# Patient Record
Sex: Male | Born: 1950 | Hispanic: No | Marital: Married | Smoking: Former smoker
Health system: Southern US, Community
[De-identification: ages and names within clinical notes are randomized; demographics above are authoritative.]

## PROBLEM LIST (undated history)

## (undated) DIAGNOSIS — Z720 Tobacco use: Secondary | ICD-10-CM

## (undated) DIAGNOSIS — J449 Chronic obstructive pulmonary disease, unspecified: Secondary | ICD-10-CM

## (undated) DIAGNOSIS — I2699 Other pulmonary embolism without acute cor pulmonale: Secondary | ICD-10-CM

## (undated) HISTORY — PX: NO PAST SURGERIES: SHX2092

---

## 2006-02-21 ENCOUNTER — Emergency Department: Payer: Self-pay | Admitting: Unknown Physician Specialty

## 2008-07-19 ENCOUNTER — Ambulatory Visit: Payer: Self-pay | Admitting: Family Medicine

## 2009-06-13 ENCOUNTER — Encounter: Payer: Self-pay | Admitting: Cardiovascular Disease

## 2009-08-22 ENCOUNTER — Emergency Department: Payer: Self-pay | Admitting: Emergency Medicine

## 2009-09-04 ENCOUNTER — Encounter: Payer: Self-pay | Admitting: Cardiovascular Disease

## 2009-11-07 ENCOUNTER — Ambulatory Visit: Payer: Self-pay | Admitting: Internal Medicine

## 2016-01-30 ENCOUNTER — Encounter: Payer: Self-pay | Admitting: Emergency Medicine

## 2016-01-30 ENCOUNTER — Ambulatory Visit
Admission: EM | Admit: 2016-01-30 | Discharge: 2016-01-30 | Disposition: A | Discharge status: Home / Self Care | Payer: Commercial Managed Care - PPO | Attending: Family Medicine | Admitting: Family Medicine

## 2016-01-30 DIAGNOSIS — H6122 Impacted cerumen, left ear: Secondary | ICD-10-CM | POA: Diagnosis not present

## 2016-01-30 DIAGNOSIS — H6091 Unspecified otitis externa, right ear: Secondary | ICD-10-CM | POA: Diagnosis not present

## 2016-01-30 MED ORDER — NEOMYCIN-POLYMYXIN-HC 3.5-10000-1 OT SUSP: 4.0000 [drp] | Freq: Three times a day (TID) | OTIC | Status: DC

## 2016-01-30 NOTE — Discharge Instructions (Signed)

## 2016-01-30 NOTE — ED Notes (Signed)
Patient c/o fullness in his ears for couple of days.

## 2016-03-09 NOTE — ED Provider Notes (Signed)
CSN: [id number]     Arrival date & time 01/30/16  1356 History   First MD Initiated Contact with Patient 01/30/16 1550     Chief Complaint  Patient presents with  . Ear Fullness   (Consider location/radiation/quality/duration/timing/severity/associated sxs/prior Treatment) HPI Comments: 65 yo male with a c/o ear fullness and discomfort for the past 2-3 days. Denies any fevers, chills, or drainage.   Patient is a 65 y.o. male presenting with plugged ear sensation. The history is provided by the patient.  Ear Fullness    History reviewed. No pertinent past medical history. History reviewed. No pertinent past surgical history. History reviewed. No pertinent family history. Social History  Substance Use Topics  . Smoking status: Current Every Day Smoker -- 1.00 packs/day    Types: Cigarettes  . Smokeless tobacco: None  . Alcohol Use: No    Review of Systems  Allergies  Review of patient's allergies indicates no known allergies.  Home Medications   Prior to Admission medications   Medication Sig Start Date End Date Taking? Authorizing Provider  neomycin-polymyxin-hydrocortisone (CORTISPORIN) 3.5-10000-1 otic suspension Place 4 drops into the right ear 3 (three) times daily. 01/30/16   Payton Mccallum, MD   Meds Ordered and Administered this Visit  Medications - No data to display  BP 116/78 mmHg  Pulse 63  Temp(Src) 97 F (36.1 C) (Tympanic)  Resp 16  Ht  (1.778 m)  Wt 210 lb (95.255 kg)  BMI 30.13 kg/m2  SpO2 98% No data found.   Physical Exam  Constitutional: He appears well-developed and well-nourished. No distress.  HENT:  Head: Normocephalic and atraumatic.  Right Ear: There is drainage and swelling.  Left Ear: Tympanic membrane and external ear normal.  Nose: Nose normal.  Mouth/Throat: Uvula is midline, oropharynx is clear and moist and mucous membranes are normal. No oropharyngeal exudate or tonsillar abscesses.  Left ear canal with cerumen impaction   Eyes: Conjunctivae and EOM are normal. Pupils are equal, round, and reactive to light. Right eye exhibits no discharge. Left eye exhibits no discharge. No scleral icterus.  Neck: Normal range of motion. Neck supple. No tracheal deviation present. No thyromegaly present.  Cardiovascular: Normal rate, regular rhythm and normal heart sounds.   Pulmonary/Chest: Effort normal and breath sounds normal. No stridor. No respiratory distress. He has no wheezes. He has no rales. He exhibits no tenderness.  Lymphadenopathy:    He has no cervical adenopathy.  Neurological: He is alert.  Skin: Skin is warm and dry. No rash noted. He is not diaphoretic.  Nursing note and vitals reviewed.   ED Course  Procedures (including critical care time)  Labs Review Labs Reviewed - No data to display  Imaging Review No results found.   Visual Acuity Review  Right Eye Distance:   Left Eye Distance:   Bilateral Distance:    Right Eye Near:   Left Eye Near:    Bilateral Near:         MDM   1. Cerumen impaction, left   2. Otitis externa, right    Discharge Medication List as of 01/30/2016  4:34 PM    START taking these medications   Details  neomycin-polymyxin-hydrocortisone (CORTISPORIN) 3.5-10000-1 otic suspension Place 4 drops into the right ear 3 (three) times daily., Starting 01/30/2016, Until Discontinued, Normal       1. diagnosis reviewed with patient 2. rx as per orders above; reviewed possible side effects, interactions, risks and benefits  3. Recommend supportive treatment with otc  analgesics prn 4. Follow-up prn if symptoms worsen or don't improve    Payton Mccallumrlando Denijah Karrer, MD 03/09/16 2013

## 2016-10-29 ENCOUNTER — Encounter: Payer: Self-pay | Admitting: Emergency Medicine

## 2016-10-29 ENCOUNTER — Emergency Department: Payer: Medicare Other

## 2016-10-29 ENCOUNTER — Inpatient Hospital Stay
Admission: EM | Admit: 2016-10-29 | Discharge: 2016-11-01 | DRG: 175 | Disposition: A | Discharge status: Home / Self Care | Payer: Medicare Other | Attending: Internal Medicine | Admitting: Internal Medicine

## 2016-10-29 ENCOUNTER — Inpatient Hospital Stay: Payer: Medicare Other

## 2016-10-29 DIAGNOSIS — I82411 Acute embolism and thrombosis of right femoral vein: Secondary | ICD-10-CM | POA: Diagnosis present

## 2016-10-29 DIAGNOSIS — R7989 Other specified abnormal findings of blood chemistry: Secondary | ICD-10-CM | POA: Diagnosis not present

## 2016-10-29 DIAGNOSIS — R0602 Shortness of breath: Secondary | ICD-10-CM

## 2016-10-29 DIAGNOSIS — I82431 Acute embolism and thrombosis of right popliteal vein: Secondary | ICD-10-CM | POA: Diagnosis present

## 2016-10-29 DIAGNOSIS — F17210 Nicotine dependence, cigarettes, uncomplicated: Secondary | ICD-10-CM | POA: Diagnosis present

## 2016-10-29 DIAGNOSIS — I2699 Other pulmonary embolism without acute cor pulmonale: Secondary | ICD-10-CM | POA: Diagnosis present

## 2016-10-29 DIAGNOSIS — J439 Emphysema, unspecified: Secondary | ICD-10-CM | POA: Diagnosis present

## 2016-10-29 DIAGNOSIS — J9601 Acute respiratory failure with hypoxia: Secondary | ICD-10-CM | POA: Diagnosis present

## 2016-10-29 DIAGNOSIS — I82409 Acute embolism and thrombosis of unspecified deep veins of unspecified lower extremity: Secondary | ICD-10-CM

## 2016-10-29 DIAGNOSIS — I82811 Embolism and thrombosis of superficial veins of right lower extremities: Secondary | ICD-10-CM | POA: Diagnosis present

## 2016-10-29 DIAGNOSIS — I248 Other forms of acute ischemic heart disease: Secondary | ICD-10-CM | POA: Diagnosis present

## 2016-10-29 HISTORY — DX: Chronic obstructive pulmonary disease, unspecified: J44.9

## 2016-10-29 HISTORY — DX: Tobacco use: Z72.0

## 2016-10-29 LAB — APTT: aPTT: 34 seconds (ref 24–36)

## 2016-10-29 LAB — INFLUENZA PANEL BY PCR (TYPE A & B)
Influenza A By PCR: NEGATIVE
Influenza B By PCR: NEGATIVE

## 2016-10-29 LAB — BASIC METABOLIC PANEL
ANION GAP: 9 (ref 5–15)
BUN: 11 mg/dL (ref 6–20)
CALCIUM: 9.5 mg/dL (ref 8.9–10.3)
CO2: 28 mmol/L (ref 22–32)
Chloride: 103 mmol/L (ref 101–111)
Creatinine, Ser: 0.91 mg/dL (ref 0.61–1.24)
GFR calc Af Amer: >60 mL/min (ref >60)
GFR calc non Af Amer: >60 mL/min (ref >60)
GLUCOSE: 104 mg/dL — AB (ref 65–99)
Potassium: 4.1 mmol/L (ref 3.5–5.1)
Sodium: 140 mmol/L (ref 135–145)

## 2016-10-29 LAB — CBC
HCT: 46.4 % (ref 40.0–52.0)
HEMOGLOBIN: 15.4 g/dL (ref 13.0–18.0)
MCH: 31.3 pg (ref 26.0–34.0)
MCHC: 33.2 g/dL (ref 32.0–36.0)
MCV: 94.2 fL (ref 80.0–100.0)
Platelets: 187 10*3/uL (ref 150–440)
RBC: 4.93 MIL/uL (ref 4.40–5.90)
RDW: 14.3 % (ref 11.5–14.5)
WBC: 16.5 10*3/uL — ABNORMAL HIGH (ref 3.8–10.6)

## 2016-10-29 LAB — TROPONIN I
TROPONIN I: 0.11 ng/mL — AB (ref <0.03)
Troponin I: 0.45 ng/mL (ref <0.03)
Troponin I: 0.43 ng/mL (ref <0.03)
Troponin I: 0.32 ng/mL (ref <0.03)

## 2016-10-29 LAB — HEPARIN LEVEL (UNFRACTIONATED): HEPARIN UNFRACTIONATED: 0.29 [IU]/mL — AB (ref 0.30–0.70)

## 2016-10-29 LAB — PROTIME-INR
INR: 1.09
Prothrombin Time: 14.1 seconds (ref 11.4–15.2)

## 2016-10-29 LAB — BRAIN NATRIURETIC PEPTIDE: B NATRIURETIC PEPTIDE 5: 26 pg/mL (ref 0.0–100.0)

## 2016-10-29 LAB — FIBRIN DERIVATIVES D-DIMER (ARMC ONLY): Fibrin derivatives D-dimer (ARMC): 8704 — ABNORMAL HIGH (ref 0–499)

## 2016-10-29 MED ORDER — NICOTINE 21 MG/24HR TD PT24
21.0000 mg | MEDICATED_PATCH | Freq: Every day | TRANSDERMAL | Status: DC
  Administered 2016-10-29 – 2016-11-01 (×4): 21 mg via TRANSDERMAL
  Filled 2016-10-29 (×4): qty 1

## 2016-10-29 MED ORDER — ONDANSETRON HCL 4 MG/2ML IJ SOLN: 4.0000 mg | Freq: Four times a day (QID) | INTRAMUSCULAR | Status: DC | PRN

## 2016-10-29 MED ORDER — SODIUM CHLORIDE 0.9% FLUSH
3.0000 mL | Freq: Two times a day (BID) | INTRAVENOUS | Status: DC
  Administered 2016-10-31: 3 mL via INTRAVENOUS

## 2016-10-29 MED ORDER — ACETAMINOPHEN 650 MG RE SUPP: 650.0000 mg | Freq: Four times a day (QID) | RECTAL | Status: DC | PRN

## 2016-10-29 MED ORDER — KETOROLAC TROMETHAMINE 30 MG/ML IJ SOLN
30.0000 mg | Freq: Four times a day (QID) | INTRAMUSCULAR | Status: DC | PRN
  Filled 2016-10-29: qty 1

## 2016-10-29 MED ORDER — HEPARIN BOLUS VIA INFUSION
1300.0000 [IU] | Freq: Once | INTRAVENOUS | Status: AC
  Administered 2016-10-29: 1300 [IU] via INTRAVENOUS
  Filled 2016-10-29: qty 1300

## 2016-10-29 MED ORDER — HEPARIN (PORCINE) IN NACL 100-0.45 UNIT/ML-% IJ SOLN
1900.0000 [IU]/h | INTRAMUSCULAR | Status: DC
  Administered 2016-10-29: 1600 [IU]/h via INTRAVENOUS
  Administered 2016-10-30: 1900 [IU]/h via INTRAVENOUS
  Administered 2016-10-30: 1750 [IU]/h via INTRAVENOUS
  Administered 2016-10-31: 1900 [IU]/h via INTRAVENOUS
  Filled 2016-10-29 (×4): qty 250

## 2016-10-29 MED ORDER — ONDANSETRON HCL 4 MG PO TABS: 4.0000 mg | ORAL_TABLET | Freq: Four times a day (QID) | ORAL | Status: DC | PRN

## 2016-10-29 MED ORDER — IOPAMIDOL (ISOVUE-370) INJECTION 76%
75.0000 mL | Freq: Once | INTRAVENOUS | Status: AC | PRN
  Administered 2016-10-29: 75 mL via INTRAVENOUS

## 2016-10-29 MED ORDER — IPRATROPIUM-ALBUTEROL 0.5-2.5 (3) MG/3ML IN SOLN: 3.0000 mL | Freq: Four times a day (QID) | RESPIRATORY_TRACT | Status: DC | PRN

## 2016-10-29 MED ORDER — ACETAMINOPHEN 325 MG PO TABS: 650.0000 mg | ORAL_TABLET | Freq: Four times a day (QID) | ORAL | Status: DC | PRN

## 2016-10-29 MED ORDER — HEPARIN SODIUM (PORCINE) 5000 UNIT/ML IJ SOLN
4000.0000 [IU] | Freq: Once | INTRAMUSCULAR | Status: AC
  Administered 2016-10-29: 4000 [IU] via INTRAVENOUS
  Filled 2016-10-29: qty 1

## 2016-10-29 MED ORDER — HEPARIN (PORCINE) IN NACL 100-0.45 UNIT/ML-% IJ SOLN: 12.0000 [IU]/kg/h | INTRAMUSCULAR | Status: DC

## 2016-10-29 MED ORDER — IPRATROPIUM-ALBUTEROL 0.5-2.5 (3) MG/3ML IN SOLN
3.0000 mL | Freq: Once | RESPIRATORY_TRACT | Status: AC
  Administered 2016-10-29: 3 mL via RESPIRATORY_TRACT
  Filled 2016-10-29: qty 3

## 2016-10-29 NOTE — Plan of Care (Signed)
Problem: Phase I Progression Outcomes Goal: Pain controlled with appropriate interventions Outcome: Progressing Patient has no complaints of pain.  Goal: Voiding-avoid urinary catheter unless indicated Outcome: Progressing Patient voiding in the urinal without any issue.

## 2016-10-29 NOTE — ED Notes (Signed)
Pt denies wanting any pain meds at this time. edp aware. Pt unable to take a deep breath due to right side rib pain and oxygen levels drop in the eighties when he falls asleep.

## 2016-10-29 NOTE — ED Notes (Signed)
Admitting md in to see pt at this time. 

## 2016-10-29 NOTE — ED Notes (Signed)
Pt o2 noted to drop in the high eighties when sleeping. NAD.

## 2016-10-29 NOTE — ED Triage Notes (Signed)
Pt complains of cough and congestion for two weeks and states today he was walking the dog and became short of breath. Pt states he feels he can not catch his breath.

## 2016-10-29 NOTE — Progress Notes (Signed)
ANTICOAGULATION CONSULT NOTE - Follow-up Consult  Pharmacy Consult for Heparin Drip Indication: pulmonary embolus  No Known Allergies  Patient Measurements: Height: 5\' 10"  (177.8 cm) Weight: 190 lb 4.8 oz (86.3 kg) IBW/kg (Calculated) : 73 Heparin Dosing Weight: 86.3 kg  Vital Signs: Temp: 99.1 F (37.3 C) (02/02 1914) Temp Source: Oral (02/02 1914) BP: 116/73 (02/02 1914) Pulse Rate: 88 (02/02 1914)  Labs:  Recent Labs  10/29/16 0739 10/29/16 1240 10/29/16 1600  HGB 15.4  --   --   HCT 46.4  --   --   PLT 187  --   --   APTT 34  --   --   LABPROT 14.1  --   --   INR 1.09  --   --   HEPARINUNFRC  --   --  0.29*  CREATININE 0.91  --   --   TROPONINI 0.11* 0.45* 0.43*    Estimated Creatinine Clearance: 83.6 mL/min (by C-G formula based on SCr of 0.91 mg/dL).   Medical History: Past Medical History:  Diagnosis Date  . COPD (chronic obstructive pulmonary disease) (HCC)   . Tobacco abuse     Medications:  Scheduled:  . nicotine  21 mg Transdermal Daily  . sodium chloride flush  3 mL Intravenous Q12H   Infusions:  . heparin 1,750 Units/hr (10/29/16 1737)    Assessment: Pharmacy consulted to dose and monitor heparin drip for this 66 yo male presenting to the ED with SOB and elevated troponin.   Goal of Therapy:  Heparin level 0.3-0.7 units/ml Monitor platelets by anticoagulation protocol: Yes   Plan:  Give 4000 units bolus x 1 Start heparin infusion at 1600 units/hr Check anti-Xa level in 6 hours and daily while on heparin Continue to monitor H&H and platelets   Heparin level ordered for 2/2 at 16:00.  2/2 1600: HL subtherapeutic @ 0.29. Will give a heparin bolus of 1300units x1 followed by an increase in heparin to 1750units/hr. Will order a HL in 6hr. CBC ordered for tomorrow.   Delsa BernKelly m Fuhrmann, PharmD 10/29/2016,7:14 PM

## 2016-10-29 NOTE — Progress Notes (Signed)
ANTICOAGULATION CONSULT NOTE - Initial Consult  Pharmacy Consult for Heparin Drip Indication: pulmonary embolus  No Known Allergies  Patient Measurements: Height: 5\' 10"  (177.8 cm) Weight: 210 lb (95.3 kg) IBW/kg (Calculated) : 73 Heparin Dosing Weight: 92.5 kg  Vital Signs: Temp: 97.8 F (36.6 C) (02/02 0722) Temp Source: Oral (02/02 0722) BP: 140/101 (02/02 0938) Pulse Rate: 109 (02/02 0938)  Labs:  Recent Labs  10/29/16 0739  HGB 15.4  HCT 46.4  PLT 187  APTT 34  LABPROT 14.1  INR 1.09  CREATININE 0.91  TROPONINI 0.11*    Estimated Creatinine Clearance: 93.8 mL/min (by C-G formula based on SCr of 0.91 mg/dL).   Medical History: History reviewed. No pertinent past medical history.  Medications:  Scheduled:   Infusions:  . heparin 1,600 Units/hr (10/29/16 1013)    Assessment: Pharmacy consulted to dose and monitor heparin drip for this 66 yo male presenting to the ED with SOB and elevated troponin.   Goal of Therapy:  Heparin level 0.3-0.7 units/ml Monitor platelets by anticoagulation protocol: Yes   Plan:  Give 4000 units bolus x 1 Start heparin infusion at 1600 units/hr Check anti-Xa level in 6 hours and daily while on heparin Continue to monitor H&H and platelets   Heparin level ordered for 2/2 at 16:00.  Stormy CardKatsoudas,Nancyjo Givhan K, RPh Clinical Pharmacist 10/29/2016,10:22 AM

## 2016-10-29 NOTE — ED Notes (Signed)
edp at bedside. Pt states he smokes a pack of cigarettes per day for 123yrs. Pt denies any medical hx. Pt placed on 4liters of oxygen n c.

## 2016-10-29 NOTE — ED Notes (Signed)
Attempt made to call report. Primary RN and Consulting civil engineerCharge RN both unavailable to take report. Will attempt again shortly.

## 2016-10-29 NOTE — H&P (Signed)
[facility] - [place] at [facility]   PATIENT NAME: Aaron Villanueva    MR#:  [id number]  DATE OF BIRTH:  [birth year]  DATE OF ADMISSION:  10/29/2016  PRIMARY CARE PHYSICIAN: Lauro RegulusANDERSON,MARSHALL W., MD   REQUESTING/REFERRING PHYSICIAN: Dr. Lacretia NicksBrian Quigley  CHIEF COMPLAINT:   Chief Complaint  Patient presents with  . Shortness of Breath    HISTORY OF PRESENT ILLNESS:  Aaron Villanueva  is a 66 y.o. male with a known history of COPD, ongoing tobacco abuse who presents to the hospital due to shortness of breath that began earlier this morning. Patient says he was walking his dog early this morning and became really winded and short of breath. He says that he has been having some exertional dyspnea over the past 2 days which has progressively gotten worse. He admits to a cough which is nonproductive, and now for the past 2 days has been having worsening right-sided pleuritic chest pain and since today it got much worse and came to the ER for further evaluation. Patient underwent a CT scan of the chest which showed pulmonary embolism and therefore hospitalist services were contacted further treatment and evaluation. Patient denies any recent calf pain, nausea, vomiting, dizziness, palpitations or any other associated symptoms presently.  PAST MEDICAL HISTORY:   Past Medical History:  Diagnosis Date  . COPD (chronic obstructive pulmonary disease) (HCC)   . Tobacco abuse     PAST SURGICAL HISTORY:  History reviewed. No pertinent surgical history.  SOCIAL HISTORY:   Social History  Substance Use Topics  . Smoking status: Current Every Day Smoker    Packs/day: 1.00    Years: 50.00    Types: Cigarettes  . Smokeless tobacco: Never Used  . Alcohol use No    FAMILY HISTORY:   Family History  Problem Relation Age of Onset  . Lung cancer Mother     DRUG ALLERGIES:  No Known Allergies  REVIEW OF SYSTEMS:   Review of Systems  Constitutional: Negative for fever and  weight loss.  HENT: Negative for congestion, nosebleeds and tinnitus.   Eyes: Negative for blurred vision, double vision and redness.  Respiratory: Positive for shortness of breath. Negative for cough and hemoptysis.   Cardiovascular: Positive for chest pain (Pleuritic). Negative for orthopnea, leg swelling and PND.  Gastrointestinal: Negative for abdominal pain, diarrhea, melena, nausea and vomiting.  Genitourinary: Negative for dysuria, hematuria and urgency.  Musculoskeletal: Negative for falls and joint pain.  Neurological: Negative for dizziness, tingling, sensory change, focal weakness, seizures, weakness and headaches.  Endo/Heme/Allergies: Negative for polydipsia. Does not bruise/bleed easily.  Psychiatric/Behavioral: Negative for depression and memory loss. The patient is not nervous/anxious.     MEDICATIONS AT HOME:   Prior to Admission medications   Not on File      VITAL SIGNS:  Blood pressure 134/87, pulse (!) 101, temperature 97.8 F (36.6 C), temperature source Oral, resp. rate (!) 22, height 5\' 10"  (1.778 m), weight 95.3 kg (210 lb), SpO2 92 %.  PHYSICAL EXAMINATION:  Physical Exam  GENERAL:  66 y.o.-year-old patient lying in the bed with no acute distress.  EYES: Pupils equal, round, reactive to light and accommodation. No scleral icterus. Extraocular muscles intact.  HEENT: Head atraumatic, normocephalic. Oropharynx and nasopharynx clear. No oropharyngeal erythema, moist oral mucosa  NECK:  Supple, no jugular venous distention. No thyroid enlargement, no tenderness.  LUNGS: Poor respiratory effort, prolonged inspiratory and expiratory phase , no wheezing, rales, rhonchi. No use of accessory muscles of respiration.  CARDIOVASCULAR: S1, S2 RRR. No murmurs, rubs, gallops, clicks.  ABDOMEN: Soft, nontender, nondistended. Bowel sounds present. No organomegaly or mass.  EXTREMITIES: No pedal edema, cyanosis, or clubbing. + 2 pedal & radial pulses b/l.   NEUROLOGIC:  Cranial nerves II through XII are intact. No focal Motor or sensory deficits appreciated b/l PSYCHIATRIC: The patient is alert and oriented x 3. Good affect.  SKIN: No obvious rash, lesion, or ulcer.   LABORATORY PANEL:   CBC  Recent Labs Lab 10/29/16 0739  WBC 16.5*  HGB 15.4  HCT 46.4  PLT 187   ------------------------------------------------------------------------------------------------------------------  Chemistries   Recent Labs Lab 10/29/16 0739  NA 140  K 4.1  CL 103  CO2 28  GLUCOSE 104*  BUN 11  CREATININE 0.91  CALCIUM 9.5   ------------------------------------------------------------------------------------------------------------------  Cardiac Enzymes  Recent Labs Lab 10/29/16 0739  TROPONINI 0.11*   ------------------------------------------------------------------------------------------------------------------  RADIOLOGY:  Dg Chest 2 View  Result Date: 10/29/2016 CLINICAL DATA:  Shortness of breath . EXAM: CHEST  2 VIEW COMPARISON:  08/23/2009 FINDINGS: Cardiomegaly with mild pulmonary vascular prominence and interstitial prominence consistent mild CHF. Pneumonitis cannot be excluded. Small right pleural effusion. No pneumothorax . IMPRESSION: Cardiomegaly with bilateral from interstitial prominence of small right pleural effusion consistent CHF. Mild pneumonitis cannot be excluded. Electronically Signed   By: Maisie Fus  Register   On: 10/29/2016 08:25   Ct Angio Chest Pe W Or Wo Contrast  Result Date: 10/29/2016 CLINICAL DATA:  66 year old male with cough and congestion for 2 weeks. Acute onset shortness of breath today. Initial encounter. EXAM: CT ANGIOGRAPHY CHEST WITH CONTRAST TECHNIQUE: Multidetector CT imaging of the chest was performed using the standard protocol during bolus administration of intravenous contrast. Multiplanar CT image reconstructions and MIPs were obtained to evaluate the vascular anatomy. CONTRAST:  75 mL Isovue 370 COMPARISON:   Chest radiographs 0806 hours today. FINDINGS: Cardiovascular: Good contrast bolus timing in the pulmonary arterial tree. Positive for bilateral pulmonary emboli affecting all lobes. There is right main pulmonary artery thrombus and extensive perihilar thrombus. RV / LV ratio = 1.3, and is associated with paradoxical bowing of the interventricular septum into the left ventricle (series 5, image 197). No saddle embolus. No pericardial effusion. Mild cardiomegaly. Negative visualized aorta. Mediastinum/Nodes: Prominent azygos arch. No mediastinal lymphadenopathy. Lungs/Pleura: Major airways are patent. Scattered paraseptal emphysema but widespread abnormal subpleural reticular opacity in both lungs. In the costophrenic angles and lower lobes this most resembles developing pulmonary fibrosis. Superimposed right lower lobe lateral basal segment and other dependent pulmonary ground-glass opacity suspicious for some developing pulmonary infarcts in this clinical setting. No consolidation. Trace right pleural effusion. Upper Abdomen: Simple fluid density 3.8 cm circumscribed cyst in the hepatic dome. Otherwise negative visualized liver, gallbladder, spleen, pancreas, right adrenal gland, and bowel in the upper abdomen. Musculoskeletal: No acute osseous abnormality identified. Review of the MIP images confirms the above findings. IMPRESSION: 1. Positive for acute PE with CT evidence of right heart strain (RV/LV Ratio = 1.3) consistent with at least submassive (intermediate risk) PE. The presence of right heart strain has been associated with an increased risk of morbidity and mortality. Please activate Code PE by paging [phone]. 2. 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 Dependent and peripheral ground-glass opacity in the right lower lobe suspicious for developing pulmonary infarcts. Trace right pleural effusion. 3. Critical Value/emergent results were called by telephone at the time of interpretation on 10/29/2016 at  9:16 am to Dr. Lacretia Nicks , who verbally acknowledged these results. Electronically Signed  By: Odessa Fleming M.D.   On: 10/29/2016 09:18     IMPRESSION AND PLAN:   66 year old male with past medical history of COPD, ongoing tobacco abuse who presents to the hospital with shortness of breath.  1. Acute respiratory failure with hypoxia-secondary to acute pulmonary embolism with underlying COPD/emphysema. -Continue O2 supplementation, will treat underlying PE with IV heparin.  2. Pulmonary embolism-this is unprovoked PE. Patient has no risk factors other than tobacco abuse. -We will place the patient on IV heparin drip, switched to oral antibiotic duration of the day or 2.  -we'll get Dopplers of his lower extremities.  3. Elevated troponin-secondary to supply demand ischemia from hypoxemia. -We'll cycle cardiac markers, observe on telemetry, check a two-dimensional echocardiogram.  4. COPD with ongoing tobacco abuse - no acute exacerbation.  - place on duonebs PRN. Assess for Home O2 prior to discharge.   5. Tobacco abuse - pt. Refused nicotine patch.    All the records are reviewed and case discussed with ED provider. Management plans discussed with the patient, family and they are in agreement.  CODE STATUS: Full Code  TOTAL TIME TAKING CARE OF THIS PATIENT: 45 minutes.    Houston Siren M.D on 10/29/2016 at 10:47 AM  Between 7am to 6pm - Pager - [phone]  After 6pm go to [email or web address] - password [email or web address] [facility]  [facility]  Office  [phone]  CC: Primary care physician; Lauro Regulus., MD

## 2016-10-29 NOTE — ED Provider Notes (Addendum)
Time Seen: Approximately *0735  I have reviewed the triage notes  Chief Complaint: Shortness of Breath   History of Present Illness: Aaron Villanueva is a 66 y.o. male who presents with cough and congestion over the last 2 weeks. He states he was walking his dog this morning he became extremely short of breath which she states is above and beyond his normal shortness of breath that he has from tobacco abuse etc. He states he could not "" catch his breath "". He denies any chest arm or jaw pain. Denies any risk factors for pulmonary embolism other than smoking. He denies any leg pain or swelling. He denies any back or flank pain. Patient arrives hypoxic at 90% on room air. He's not on any home oxygen therapy.  History reviewed. No pertinent past medical history.  There are no active problems to display for this patient.   History reviewed. No pertinent surgical history.  History reviewed. No pertinent surgical history.    Allergies:  Patient has no known allergies.  Family History: History reviewed. No pertinent family history.  Social History: Social History  Substance Use Topics  . Smoking status: Current Every Day Smoker    Packs/day: 1.00    Types: Cigarettes  . Smokeless tobacco: Never Used  . Alcohol use No     Review of Systems:   10 point review of systems was performed and was otherwise negative:  Constitutional: No fever Eyes: No visual disturbances ENT: No sore throat, ear pain Cardiac: No chest pain Respiratory:Shortness of breath mainly with exertion. No obvious wheezing at home. Occasional dry eyes usually nonproductive cough. Abdomen: No abdominal pain, no vomiting, No diarrhea Endocrine: No weight loss, No night sweats Extremities: No peripheral edema, cyanosis Skin: No rashes, easy bruising Neurologic: No focal weakness, trouble with speech or swollowing Urologic: No dysuria, Hematuria, or urinary frequency   Physical Exam:  ED Triage Vitals   Enc Vitals Group     BP 10/29/16 0722 97/63     Pulse Rate 10/29/16 0722 (!) 101     Resp 10/29/16 0722 (!) 28     Temp 10/29/16 0722 97.8 F (36.6 C)     Temp Source 10/29/16 0722 Oral     SpO2 10/29/16 0722 90 %     Weight 10/29/16 0722 210 lb (95.3 kg)     Height 10/29/16 0722 5\' 10"  (1.778 m)     Head Circumference --      Peak Flow --      Pain Score 10/29/16 0723 8     Pain Loc --      Pain Edu? --      Excl. in GC? --     General: Awake , Alert , and Oriented times 3; GCS 15 Head: Normal cephalic , atraumatic Eyes: Pupils equal , round, reactive to light Nose/Throat: No nasal drainage, patent upper airway without erythema or exudate.  Neck: Supple, Full range of motion, No anterior adenopathy or palpable thyroid masses Lungs: Mild end expiratory wheezing heard primarily at the apices without rales or rhonchi noted Heart: Regular rate, regular rhythm without murmurs , gallops , or rubs Abdomen: Soft, non tender without rebound, guarding , or rigidity; bowel sounds positive and symmetric in all 4 quadrants. No organomegaly .        Extremities: 2 plus symmetric pulses. No edema, clubbing or cyanosis. No calf tenderness Neurologic: normal ambulation, Motor symmetric without deficits, sensory intact Skin: warm, dry, no rashes   Labs:  All laboratory work was reviewed including any pertinent negatives or positives listed below:  Labs Reviewed  BASIC METABOLIC PANEL - Abnormal; Notable for the following:       Result Value   Glucose, Bld 104 (*)    All other components within normal limits  CBC - Abnormal; Notable for the following:    WBC 16.5 (*)    All other components within normal limits  TROPONIN I - Abnormal; Notable for the following:    Troponin I 0.11 (*)    All other components within normal limits  FIBRIN DERIVATIVES D-DIMER ([facility] ONLY)  INFLUENZA PANEL BY PCR (TYPE A & B)    EKG: * ED ECG REPORT I, Jennye Moccasin, the attending physician, personally  viewed and interpreted this ECG.  Date: 10/29/2016 EKG Time: 0730 Rate: 101 Rhythm: normal sinus rhythm QRS Axis: normal Intervals: Incomplete right bundle branch block ST/T Wave abnormalities: normal Conduction Disturbances: none Narrative Interpretation: unremarkable No acute ischemic changes noted   Radiology:   "Dg Chest 2 View  Result Date: 10/29/2016 CLINICAL DATA:  Shortness of breath . EXAM: CHEST  2 VIEW COMPARISON:  08/23/2009 FINDINGS: Cardiomegaly with mild pulmonary vascular prominence and interstitial prominence consistent mild CHF. Pneumonitis cannot be excluded. Small right pleural effusion. No pneumothorax . IMPRESSION: Cardiomegaly with bilateral from interstitial prominence of small right pleural effusion consistent CHF. Mild pneumonitis cannot be excluded. Electronically Signed   By: Maisie Fus  Register   On: 10/29/2016 08:25   Ct Angio Chest Pe W Or Wo Contrast  Result Date: 10/29/2016 CLINICAL DATA:  66 year old male with cough and congestion for 2 weeks. Acute onset shortness of breath today. Initial encounter. EXAM: CT ANGIOGRAPHY CHEST WITH CONTRAST TECHNIQUE: Multidetector CT imaging of the chest was performed using the standard protocol during bolus administration of intravenous contrast. Multiplanar CT image reconstructions and MIPs were obtained to evaluate the vascular anatomy. CONTRAST:  75 mL Isovue 370 COMPARISON:  Chest radiographs 0806 hours today. FINDINGS: Cardiovascular: Good contrast bolus timing in the pulmonary arterial tree. Positive for bilateral pulmonary emboli affecting all lobes. There is right main pulmonary artery thrombus and extensive perihilar thrombus. RV / LV ratio = 1.3, and is associated with paradoxical bowing of the interventricular septum into the left ventricle (series 5, image 197). No saddle embolus. No pericardial effusion. Mild cardiomegaly. Negative visualized aorta. Mediastinum/Nodes: Prominent azygos arch. No mediastinal  lymphadenopathy. Lungs/Pleura: Major airways are patent. Scattered paraseptal emphysema but widespread abnormal subpleural reticular opacity in both lungs. In the costophrenic angles and lower lobes this most resembles developing pulmonary fibrosis. Superimposed right lower lobe lateral basal segment and other dependent pulmonary ground-glass opacity suspicious for some developing pulmonary infarcts in this clinical setting. No consolidation. Trace right pleural effusion. Upper Abdomen: Simple fluid density 3.8 cm circumscribed cyst in the hepatic dome. Otherwise negative visualized liver, gallbladder, spleen, pancreas, right adrenal gland, and bowel in the upper abdomen. Musculoskeletal: No acute osseous abnormality identified. Review of the MIP images confirms the above findings. IMPRESSION: 1. Positive for acute PE with CT evidence of right heart strain (RV/LV Ratio = 1.3) consistent with at least submassive (intermediate risk) PE. The presence of right heart strain has been associated with an increased risk of morbidity and mortality. Please activate Code PE by paging [phone]. 2. 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 Dependent and peripheral ground-glass opacity in the right lower lobe suspicious for developing pulmonary infarcts. Trace right pleural effusion. 3. Critical Value/emergent results were called by telephone at the time of  interpretation on 10/29/2016 at 9:16 am to Dr. Lacretia NicksBRIAN QUIGLEY , who verbally acknowledged these results. Electronically Signed   By: Odessa FlemingH  Hall M.D.   On: 10/29/2016 09:18  "    I personally reviewed the radiologic studies      Critical Care: * CRITICAL CARE Performed by: Jennye MoccasinBrian S Quigley   Total critical care time: 47 minutes  Critical care time was exclusive of separately billable procedures and treating other patients.  Critical care was necessary to treat or prevent imminent or life-threatening deterioration.  Critical care was time spent personally by me  on the following activities: development of treatment plan with patient and/or surrogate as well as nursing, discussions with consultants, evaluation of patient's response to treatment, examination of patient, obtaining history from patient or surrogate, ordering and performing treatments and interventions, ordering and review of laboratory studies, ordering and review of radiographic studies, pulse oximetry and re-evaluation of patient's condition. Evaluation for her hypoxia initial treatment plan for large pulmonary embolism with anticoagulant therapy    ED Course:  Patient's stay here essentially uneventful he was placed on supplemental oxygen which increased his pulse ox to 95% on room air. Patient is not currently hypotensive Patient was initiated on a heparin bolus followed by heparin drip. The patient's case [facility] PE protocol and at this time he felt he was not a TPA candidate and will require transfer unless the patient desired. Patient has been increased on his supplemental oxygen up to 4 L. He stabilizes at 91-92%. He states he feels better and denies any shortness of breath at this time.  Assessment: * Large acute pulmonary embolism     Plan:  Inpatient            Jennye MoccasinBrian S Quigley, MD 10/29/16 1037    Jennye MoccasinBrian S Quigley, MD 10/29/16 1045

## 2016-10-29 NOTE — ED Notes (Signed)
Pt xray completed. Elevated troponin of 0.11 reported verbally to Dr Huel CoteQuigley.

## 2016-10-30 LAB — CBC
HCT: 40 % (ref 40.0–52.0)
Hemoglobin: 13.8 g/dL (ref 13.0–18.0)
MCH: 32.2 pg (ref 26.0–34.0)
MCHC: 34.4 g/dL (ref 32.0–36.0)
MCV: 93.4 fL (ref 80.0–100.0)
PLATELETS: 153 10*3/uL (ref 150–440)
RBC: 4.28 MIL/uL — ABNORMAL LOW (ref 4.40–5.90)
RDW: 14.2 % (ref 11.5–14.5)
WBC: 13.7 10*3/uL — ABNORMAL HIGH (ref 3.8–10.6)

## 2016-10-30 LAB — BASIC METABOLIC PANEL
Anion gap: 9 (ref 5–15)
BUN: 11 mg/dL (ref 6–20)
CALCIUM: 8.7 mg/dL — AB (ref 8.9–10.3)
CO2: 25 mmol/L (ref 22–32)
CREATININE: 0.77 mg/dL (ref 0.61–1.24)
Chloride: 103 mmol/L (ref 101–111)
GFR calc Af Amer: >60 mL/min (ref >60)
Glucose, Bld: 105 mg/dL — ABNORMAL HIGH (ref 65–99)
Potassium: 4.1 mmol/L (ref 3.5–5.1)
Sodium: 137 mmol/L (ref 135–145)

## 2016-10-30 LAB — HEPARIN LEVEL (UNFRACTIONATED)
HEPARIN UNFRACTIONATED: 0.29 [IU]/mL — AB (ref 0.30–0.70)
HEPARIN UNFRACTIONATED: 0.47 [IU]/mL (ref 0.30–0.70)
HEPARIN UNFRACTIONATED: 0.47 [IU]/mL (ref 0.30–0.70)
HEPARIN UNFRACTIONATED: 0.51 [IU]/mL (ref 0.30–0.70)

## 2016-10-30 MED ORDER — HEPARIN BOLUS VIA INFUSION
1300.0000 [IU] | Freq: Once | INTRAVENOUS | Status: AC
  Administered 2016-10-30: 1300 [IU] via INTRAVENOUS
  Filled 2016-10-30: qty 1300

## 2016-10-30 NOTE — Progress Notes (Signed)
ANTICOAGULATION CONSULT NOTE - Follow-up Consult  Pharmacy Consult for Heparin Drip Indication: pulmonary embolus  No Known Allergies  Patient Measurements: Height: 5\' 10"  (177.8 cm) Weight: 190 lb 4.8 oz (86.3 kg) IBW/kg (Calculated) : 73 Heparin Dosing Weight: 86.3 kg  Vital Signs: Temp: 97.8 F (36.6 C) (02/03 1142) Temp Source: Oral (02/03 1142) BP: 112/81 (02/03 1142) Pulse Rate: 81 (02/03 1142)  Labs:  Recent Labs  10/29/16 0739 10/29/16 1240  10/29/16 1600 10/29/16 2007 10/29/16 2336 10/30/16 0544 10/30/16 1346  HGB 15.4  --   --   --   --   --  13.8  --   HCT 46.4  --   --   --   --   --  40.0  --   PLT 187  --   --   --   --   --  153  --   APTT 34  --   --   --   --   --   --   --   LABPROT 14.1  --   --   --   --   --   --   --   INR 1.09  --   --   --   --   --   --   --   HEPARINUNFRC  --   --   < > 0.29*  --  0.47 0.29* 0.47  CREATININE 0.91  --   --   --   --   --  0.77  --   TROPONINI 0.11* 0.45*  --  0.43* 0.32*  --   --   --   < > = values in this interval not displayed.  Estimated Creatinine Clearance: 95.1 mL/min (by C-G formula based on SCr of 0.77 mg/dL).   Medical History: Past Medical History:  Diagnosis Date  . COPD (chronic obstructive pulmonary disease) (HCC)   . Tobacco abuse     Medications:  Scheduled:  . nicotine  21 mg Transdermal Daily  . sodium chloride flush  3 mL Intravenous Q12H   Infusions:  . heparin 1,900 Units/hr (10/30/16 1341)    Assessment: Pharmacy consulted to dose and monitor heparin drip for this 66 yo male presenting to the ED with SOB and elevated troponin.   Goal of Therapy:  Heparin level 0.3-0.7 units/ml Monitor platelets by anticoagulation protocol: Yes   Plan:  Give 4000 units bolus x 1 Start heparin infusion at 1600 units/hr Check anti-Xa level in 6 hours and daily while on heparin Continue to monitor H&H and platelets   Heparin level ordered for 2/2 at 16:00.  2/2 1600: HL  subtherapeutic @ 0.29. Will give a heparin bolus of 1300units x1 followed by an increase in heparin to 1750units/hr. Will order a HL in 6hr. CBC ordered for tomorrow.   2/2 2336 heparin level therapeutic x 1. Continue current rate. Will recheck HL in 6 hours.  2/3 0544 heparin level subtherapeutic. 1300 units IV x 1 bolus and increase rate to 1900 units/hr. Will recheck HL in 6 hours.  2/3 HL @ 1346= 0.47.  Will continue current rate of 1900 units/hr. Will check confirmatory level in 6 hrs.  Angelique BlonderMerrill,Graylen Noboa A, PharmD, BCPS Clinical Pharmacist 10/30/2016,3:04 PM

## 2016-10-30 NOTE — Progress Notes (Signed)
[facility] [place] at [facility]   PATIENT NAME: Aaron Villanueva    MR#:  [id number]  DATE OF BIRTH:  12/16/1950  SUBJECTIVE:acute respiratory failure with hypoxia secondary to pulmonary embolus. Still on 2 L of oxygen. O2 sat went down to 87% on room air. Denies any complaints more pleuritic chest pain.  CHIEF COMPLAINT:   Chief Complaint  Patient presents with  . Shortness of Breath    REVIEW OF SYSTEMS:   ROS CONSTITUTIONAL: No fever, fatigue or weakness.  EYES: No blurred or double vision.  EARS, NOSE, AND THROAT: No tinnitus or ear pain.  RESPIRATORY: No cough, shortness of breath, wheezing or hemoptysis.  CARDIOVASCULAR: No chest pain, orthopnea, edema.  GASTROINTESTINAL: No nausea, vomiting, diarrhea or abdominal pain.  GENITOURINARY: No dysuria, hematuria.  ENDOCRINE: No polyuria, nocturia,  HEMATOLOGY: No anemia, easy bruising or bleeding SKIN: No rash or lesion. MUSCULOSKELETAL: No joint pain or arthritis.   NEUROLOGIC: No tingling, numbness, weakness.  PSYCHIATRY: No anxiety or depression.   DRUG ALLERGIES:  No Known Allergies  VITALS:  Blood pressure 115/79, pulse 79, temperature 97.9 F (36.6 C), temperature source Oral, resp. rate 18, height 5\' 10"  (1.778 m), weight 86.3 kg (190 lb 4.8 oz), SpO2 94 %.  PHYSICAL EXAMINATION:  GENERAL:  66 y.o.-year-old patient lying in the bed with no acute distress.  EYES: Pupils equal, round, reactive to light and accommodation. No scleral icterus. Extraocular muscles intact.  HEENT: Head atraumatic, normocephalic. Oropharynx and nasopharynx clear.  NECK:  Supple, no jugular venous distention. No thyroid enlargement, no tenderness.  LUNGS: Normal breath sounds bilaterally, no wheezing, rales,rhonchi or crepitation. No use of accessory muscles of respiration.  CARDIOVASCULAR: S1, S2 normal. No murmurs, rubs, or gallops.  ABDOMEN: Soft, nontender, nondistended. Bowel sounds present. No organomegaly  or mass.  EXTREMITIES: No pedal edema, cyanosis, or clubbing.  NEUROLOGIC: Cranial nerves II through XII are intact. Muscle strength 5/5 in all extremities. Sensation intact. Gait not checked.  PSYCHIATRIC: The patient is alert and oriented x 3.  SKIN: No obvious rash, lesion, or ulcer.    LABORATORY PANEL:   CBC  Recent Labs Lab 10/30/16 0544  WBC 13.7*  HGB 13.8  HCT 40.0  PLT 153   ------------------------------------------------------------------------------------------------------------------  Chemistries   Recent Labs Lab 10/30/16 0544  NA 137  K 4.1  CL 103  CO2 25  GLUCOSE 105*  BUN 11  CREATININE 0.77  CALCIUM 8.7*   ------------------------------------------------------------------------------------------------------------------  Cardiac Enzymes  Recent Labs Lab 10/29/16 2007  TROPONINI 0.32*   ------------------------------------------------------------------------------------------------------------------  RADIOLOGY:  Dg Chest 2 View  Result Date: 10/29/2016 CLINICAL DATA:  Shortness of breath . EXAM: CHEST  2 VIEW COMPARISON:  08/23/2009 FINDINGS: Cardiomegaly with mild pulmonary vascular prominence and interstitial prominence consistent mild CHF. Pneumonitis cannot be excluded. Small right pleural effusion. No pneumothorax . IMPRESSION: Cardiomegaly with bilateral from interstitial prominence of small right pleural effusion consistent CHF. Mild pneumonitis cannot be excluded. Electronically Signed   By: Maisie Fus  Register   On: 10/29/2016 08:25   Ct Angio Chest Pe W Or Wo Contrast  Result Date: 10/29/2016 CLINICAL DATA:  66 year old male with cough and congestion for 2 weeks. Acute onset shortness of breath today. Initial encounter. EXAM: CT ANGIOGRAPHY CHEST WITH CONTRAST TECHNIQUE: Multidetector CT imaging of the chest was performed using the standard protocol during bolus administration of intravenous contrast. Multiplanar CT image reconstructions and  MIPs were obtained to evaluate the vascular anatomy. CONTRAST:  75 mL Isovue 370  COMPARISON:  Chest radiographs 0806 hours today. FINDINGS: Cardiovascular: Good contrast bolus timing in the pulmonary arterial tree. Positive for bilateral pulmonary emboli affecting all lobes. There is right main pulmonary artery thrombus and extensive perihilar thrombus. RV / LV ratio = 1.3, and is associated with paradoxical bowing of the interventricular septum into the left ventricle (series 5, image 197). No saddle embolus. No pericardial effusion. Mild cardiomegaly. Negative visualized aorta. Mediastinum/Nodes: Prominent azygos arch. No mediastinal lymphadenopathy. Lungs/Pleura: Major airways are patent. Scattered paraseptal emphysema but widespread abnormal subpleural reticular opacity in both lungs. In the costophrenic angles and lower lobes this most resembles developing pulmonary fibrosis. Superimposed right lower lobe lateral basal segment and other dependent pulmonary ground-glass opacity suspicious for some developing pulmonary infarcts in this clinical setting. No consolidation. Trace right pleural effusion. Upper Abdomen: Simple fluid density 3.8 cm circumscribed cyst in the hepatic dome. Otherwise negative visualized liver, gallbladder, spleen, pancreas, right adrenal gland, and bowel in the upper abdomen. Musculoskeletal: No acute osseous abnormality identified. Review of the MIP images confirms the above findings. IMPRESSION: 1. Positive for acute PE with CT evidence of right heart strain (RV/LV Ratio = 1.3) consistent with at least submassive (intermediate risk) PE. The presence of right heart strain has been associated with an increased risk of morbidity and mortality. Please activate Code PE by paging [phone]. 2. 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 Dependent and peripheral ground-glass opacity in the right lower lobe suspicious for developing pulmonary infarcts. Trace right pleural effusion. 3. Critical  Value/emergent results were called by telephone at the time of interpretation on 10/29/2016 at 9:16 am to Dr. Lacretia Nicks , who verbally acknowledged these results. Electronically Signed   By: Odessa Fleming M.D.   On: 10/29/2016 09:18   US Venous Img Lower Bilateral  Result Date: 10/29/2016 CLINICAL DATA:  Shortness of breath. EXAM: BILATERAL LOWER EXTREMITY VENOUS DOPPLER ULTRASOUND TECHNIQUE: Gray-scale sonography with graded compression, as well as color Doppler and duplex ultrasound were performed to evaluate the lower extremity deep venous systems from the level of the common femoral vein and including the common femoral, femoral, profunda femoral, popliteal and calf veins including the posterior tibial, peroneal and gastrocnemius veins when visible. The superficial great saphenous vein was also interrogated. Spectral Doppler was utilized to evaluate flow at rest and with distal augmentation maneuvers in the common femoral, femoral and popliteal veins. COMPARISON:  No prior. FINDINGS: RIGHT LOWER EXTREMITY Common Femoral Vein: No evidence of thrombus. Normal compressibility, respiratory phasicity and response to augmentation. Saphenofemoral Junction: No evidence of thrombus. Normal compressibility and flow on color Doppler imaging. Profunda Femoral Vein: No evidence of thrombus. Normal compressibility and flow on color Doppler imaging. Femoral Vein: Nonocclusive thrombus. Popliteal Vein: Nonocclusive thrombus. Calf Veins: No evidence of thrombus. Normal compressibility and flow on color Doppler imaging. Superficial Great Saphenous Vein: Nonocclusive thrombus in the lesser saphenous vein. Other Findings:  Slow flow noted bilaterally. LEFT LOWER EXTREMITY Common Femoral Vein: No evidence of thrombus. Normal compressibility, respiratory phasicity and response to augmentation. Saphenofemoral Junction: No evidence of thrombus. Normal compressibility and flow on color Doppler imaging. Profunda Femoral Vein: No evidence  of thrombus. Normal compressibility and flow on color Doppler imaging. Femoral Vein: No evidence of thrombus. Normal compressibility, respiratory phasicity and response to augmentation. Popliteal Vein: No evidence of thrombus. Normal compressibility, respiratory phasicity and response to augmentation. Calf Veins: No evidence of thrombus. Normal compressibility and flow on color Doppler imaging. Superficial Great Saphenous Vein: No evidence of thrombus. Normal compressibility and flow  on color Doppler imaging. Other Findings:  Slow flow noted bilaterally. IMPRESSION: Positive study for right lower extremity deep venous and superficial venous thrombosis with nonocclusive thrombus noted in the right femoral, popliteal vein, and lesser saphenous veins. Electronically Signed   By: Maisie Fushomas  Register   On: 10/29/2016 15:39    EKG:   Orders placed or performed during the hospital encounter of 10/29/16  . EKG 12-Lead  . EKG 12-Lead  . ED EKG within 10 minutes  . ED EKG within 10 minutes    ASSESSMENT AND PLAN:  #1 acute respiratory failure with hypoxia secondary to acute pulmonary embolus. The source is likely from right leg DVT. Patient on heparin drip  Till now ,start him on Eliquis today,discussed Findings with patient. 2.slightly elevated troponin's, likely due to PE: Check echocardiogram. No chest pain. #3/ Likely discharge tomorrow home with  Eliquis., check O2 sat's on ambulation. 4.heavy tobacco abuse, 50-pack-years history;conselled to quit.  D/2 wife   All the records are reviewed and case discussed with Care Management/Social Workerr. Management plans discussed with the patient, family and they are in agreement.  CODE STATUS:full  TOTAL TIME TAKING CARE OF THIS PATIENT: 35 minutes.   POSSIBLE D/C IN 1-2 DAYS, DEPENDING ON CLINICAL CONDITION.   Katha HammingKONIDENA,Marchetta Navratil M.D on 10/30/2016 at 11:15 AM  Between 7am to 6pm - Pager - [phone]  After 6pm go to [email or web address] - password [email or web address]  [facility]  [facility] [place] [facility]  Office  [phone]  CC: Primary care physician; Lauro RegulusANDERSON,MARSHALL W., MD   Note: This dictation was prepared with Dragon dictation along with smaller phrase technology. Any transcriptional errors that result from this process are unintentional.

## 2016-10-30 NOTE — Progress Notes (Addendum)
ANTICOAGULATION CONSULT NOTE - Follow-up Consult  Pharmacy Consult for Heparin Drip Indication: pulmonary embolus  No Known Allergies  Patient Measurements: Height: 5\' 10"  (177.8 cm) Weight: 190 lb 4.8 oz (86.3 kg) IBW/kg (Calculated) : 73 Heparin Dosing Weight: 86.3 kg  Vital Signs: Temp: 99.1 F (37.3 C) (02/02 1914) Temp Source: Oral (02/02 1914) BP: 116/73 (02/02 1914) Pulse Rate: 88 (02/02 1914)  Labs:  Recent Labs  10/29/16 0739 10/29/16 1240 10/29/16 1600 10/29/16 2007 10/29/16 2336  HGB 15.4  --   --   --   --   HCT 46.4  --   --   --   --   PLT 187  --   --   --   --   APTT 34  --   --   --   --   LABPROT 14.1  --   --   --   --   INR 1.09  --   --   --   --   HEPARINUNFRC  --   --  0.29*  --  0.47  CREATININE 0.91  --   --   --   --   TROPONINI 0.11* 0.45* 0.43* 0.32*  --     Estimated Creatinine Clearance: 83.6 mL/min (by C-G formula based on SCr of 0.91 mg/dL).   Medical History: Past Medical History:  Diagnosis Date  . COPD (chronic obstructive pulmonary disease) (HCC)   . Tobacco abuse     Medications:  Scheduled:  . nicotine  21 mg Transdermal Daily  . sodium chloride flush  3 mL Intravenous Q12H   Infusions:  . heparin 1,750 Units/hr (10/30/16 0030)    Assessment: Pharmacy consulted to dose and monitor heparin drip for this 66 yo male presenting to the ED with SOB and elevated troponin.   Goal of Therapy:  Heparin level 0.3-0.7 units/ml Monitor platelets by anticoagulation protocol: Yes   Plan:  Give 4000 units bolus x 1 Start heparin infusion at 1600 units/hr Check anti-Xa level in 6 hours and daily while on heparin Continue to monitor H&H and platelets   Heparin level ordered for 2/2 at 16:00.  2/2 1600: HL subtherapeutic @ 0.29. Will give a heparin bolus of 1300units x1 followed by an increase in heparin to 1750units/hr. Will order a HL in 6hr. CBC ordered for tomorrow.   2/2 2336 heparin level therapeutic x 1. Continue  current rate. Will recheck HL in 6 hours.  2/3 0544 heparin level subtherapeutic. 1300 units IV x 1 bolus and increase rate to 1900 units/hr. Will recheck HL in 6 hours.  Carola FrostNathan A Mikena Masoner, PharmD, BCPS Clinical Pharmacist 10/30/2016,12:52 AM

## 2016-10-30 NOTE — Progress Notes (Addendum)
ANTICOAGULATION CONSULT NOTE - Follow-up Consult  Pharmacy Consult for Heparin Drip Indication: pulmonary embolus  No Known Allergies  Patient Measurements: Height: 5\' 10"  (177.8 cm) Weight: 190 lb 4.8 oz (86.3 kg) IBW/kg (Calculated) : 73 Heparin Dosing Weight: 86.3 kg  Vital Signs: Temp: 98.1 F (36.7 C) (02/03 2023) Temp Source: Oral (02/03 2023) BP: 115/74 (02/03 2023) Pulse Rate: 79 (02/03 2023)  Labs:  Recent Labs  10/29/16 0739 10/29/16 1240 10/29/16 1600 10/29/16 2007  10/30/16 0544 10/30/16 1346 10/30/16 1952  HGB 15.4  --   --   --   --  13.8  --   --   HCT 46.4  --   --   --   --  40.0  --   --   PLT 187  --   --   --   --  153  --   --   APTT 34  --   --   --   --   --   --   --   LABPROT 14.1  --   --   --   --   --   --   --   INR 1.09  --   --   --   --   --   --   --   HEPARINUNFRC  --   --  0.29*  --   < > 0.29* 0.47 0.51  CREATININE 0.91  --   --   --   --  0.77  --   --   TROPONINI 0.11* 0.45* 0.43* 0.32*  --   --   --   --   < > = values in this interval not displayed.  Estimated Creatinine Clearance: 95.1 mL/min (by C-G formula based on SCr of 0.77 mg/dL).   Medical History: Past Medical History:  Diagnosis Date  . COPD (chronic obstructive pulmonary disease) (HCC)   . Tobacco abuse     Medications:  Scheduled:  . nicotine  21 mg Transdermal Daily  . sodium chloride flush  3 mL Intravenous Q12H   Infusions:  . heparin 1,900 Units/hr (10/30/16 1341)    Assessment: Pharmacy consulted to dose and monitor heparin drip for this 66 yo male presenting to the ED with SOB and elevated troponin.   Goal of Therapy:  Heparin level 0.3-0.7 units/ml Monitor platelets by anticoagulation protocol: Yes   Plan:  Give 4000 units bolus x 1 Start heparin infusion at 1600 units/hr Check anti-Xa level in 6 hours and daily while on heparin Continue to monitor H&H and platelets   Heparin level ordered for 2/2 at 16:00.  2/2 1600: HL  subtherapeutic @ 0.29. Will give a heparin bolus of 1300units x1 followed by an increase in heparin to 1750units/hr. Will order a HL in 6hr. CBC ordered for tomorrow.   2/2 2336 heparin level therapeutic x 1. Continue current rate. Will recheck HL in 6 hours.  2/3 0544 heparin level subtherapeutic. 1300 units IV x 1 bolus and increase rate to 1900 units/hr. Will recheck HL in 6 hours.  2/3 HL @ 1346= 0.47.  Will continue current rate of 1900 units/hr. Will check confirmatory level in 6 hrs.  2/3 1800 HL therapeutic at 0.51. Continue rate at 1900 units/hr. Will check daily now. Check level in AM along with CBC  2/4 0556 HL therapeutic. Continue current rate. Will recheck HL and CBC daily. Paris Hohn A. [place]son, Vermont[place]arm.D., BCPS  Olene FlossMelissa D Maccia, PharmD, BCPS Clinical Pharmacist 10/30/2016,9:09 PM

## 2016-10-31 ENCOUNTER — Inpatient Hospital Stay: Admit: 2016-10-31 | Payer: Medicare Other

## 2016-10-31 LAB — CBC
HEMATOCRIT: 40.7 % (ref 40.0–52.0)
HEMOGLOBIN: 14.2 g/dL (ref 13.0–18.0)
MCH: 32.1 pg (ref 26.0–34.0)
MCHC: 34.9 g/dL (ref 32.0–36.0)
MCV: 92.2 fL (ref 80.0–100.0)
Platelets: 153 10*3/uL (ref 150–440)
RBC: 4.42 MIL/uL (ref 4.40–5.90)
RDW: 14 % (ref 11.5–14.5)
WBC: 11.4 10*3/uL — ABNORMAL HIGH (ref 3.8–10.6)

## 2016-10-31 LAB — HEPARIN LEVEL (UNFRACTIONATED): HEPARIN UNFRACTIONATED: 0.44 [IU]/mL (ref 0.30–0.70)

## 2016-10-31 MED ORDER — APIXABAN 5 MG PO TABS: 10.0000 mg | ORAL_TABLET | Freq: Two times a day (BID) | ORAL | Status: DC

## 2016-10-31 MED ORDER — APIXABAN 5 MG PO TABS: 10.0000 mg | ORAL_TABLET | Freq: Two times a day (BID) | ORAL | 0 refills | Status: AC

## 2016-10-31 MED ORDER — APIXABAN 5 MG PO TABS
10.0000 mg | ORAL_TABLET | Freq: Two times a day (BID) | ORAL | Status: DC
  Administered 2016-10-31: 10 mg via ORAL

## 2016-10-31 MED ORDER — APIXABAN 5 MG PO TABS
10.0000 mg | ORAL_TABLET | Freq: Two times a day (BID) | ORAL | Status: DC
  Administered 2016-10-31 – 2016-11-01 (×3): 10 mg via ORAL
  Filled 2016-10-31 (×3): qty 2

## 2016-10-31 MED ORDER — APIXABAN 5 MG PO TABS: 5.0000 mg | ORAL_TABLET | Freq: Two times a day (BID) | ORAL | Status: DC

## 2016-10-31 NOTE — Progress Notes (Signed)
ANTICOAGULATION CONSULT NOTE - Follow-up Consult  Pharmacy Consult for Heparin Drip Indication: pulmonary embolus  No Known Allergies  Patient Measurements: Height: 5\' 10"  (177.8 cm) Weight: 190 lb 4.8 oz (86.3 kg) IBW/kg (Calculated) : 73 Heparin Dosing Weight: 86.3 kg  Vital Signs: Temp: 98.1 F (36.7 C) (02/04 0800) Temp Source: Oral (02/04 0800) BP: 143/94 (02/04 0800) Pulse Rate: 78 (02/04 0800)  Labs:  Recent Labs  10/29/16 0739 10/29/16 1240 10/29/16 1600 10/29/16 2007  10/30/16 0544 10/30/16 1346 10/30/16 1952 10/31/16 0556  HGB 15.4  --   --   --   --  13.8  --   --  14.2  HCT 46.4  --   --   --   --  40.0  --   --  40.7  PLT 187  --   --   --   --  153  --   --  153  APTT 34  --   --   --   --   --   --   --   --   LABPROT 14.1  --   --   --   --   --   --   --   --   INR 1.09  --   --   --   --   --   --   --   --   HEPARINUNFRC  --   --  0.29*  --   < > 0.29* 0.47 0.51 0.44  CREATININE 0.91  --   --   --   --  0.77  --   --   --   TROPONINI 0.11* 0.45* 0.43* 0.32*  --   --   --   --   --   < > = values in this interval not displayed.  Estimated Creatinine Clearance: 95.1 mL/min (by C-G formula based on SCr of 0.77 mg/dL).   Medical History: Past Medical History:  Diagnosis Date  . COPD (chronic obstructive pulmonary disease) (HCC)   . Tobacco abuse     Medications:  Scheduled:  . [START ON 11/07/2016] apixaban  5 mg Oral BID   Followed by  . apixaban  10 mg Oral BID  . nicotine  21 mg Transdermal Daily  . sodium chloride flush  3 mL Intravenous Q12H   Infusions:    Assessment: Pharmacy consulted to dose and monitor heparin drip for this 66 yo male with PE on heparin drip to transition to apixaban  Plan:  Current orders for heparin 1900 units/hr. Will stop heparin drip and start apixaban 10mg  PO BID x7d followed by apixaban 5mg  PO BID. RN to stop heparin drip when administering first dose.  Martyn MalayBarefoot,Nupur Hohman C, PharmD, BCPS Clinical  Pharmacist 10/31/2016,10:54 AM

## 2016-10-31 NOTE — Discharge Instructions (Signed)
Information on my medicine - ELIQUIS (apixaban)  This medication education was reviewed with me or my healthcare representative as part of my discharge preparation.  The pharmacist that spoke with me during my hospital stay was:  Martyn MalayBarefoot,Caralina Nop C, [facility] IncRPH  Why was Eliquis prescribed for you? Eliquis was prescribed to treat blood clots that may have been found in the veins of your legs (deep vein thrombosis) or in your lungs (pulmonary embolism) and to reduce the risk of them occurring again.  What do You need to know about Eliquis ? The starting dose is 10 mg (two 5 mg tablets) taken TWICE daily for the FIRST SEVEN (7) DAYS, then on (enter date) February 11th  the dose is reduced to ONE 5 mg tablet taken TWICE daily.  Eliquis may be taken with or without food.   Try to take the dose about the same time in the morning and in the evening. If you have difficulty swallowing the tablet whole please discuss with your pharmacist how to take the medication safely.  Take Eliquis exactly as prescribed and DO NOT stop taking Eliquis without talking to the doctor who prescribed the medication.  Stopping may increase your risk of developing a new blood clot.  Refill your prescription before you run out.  After discharge, you should have regular check-up appointments with your healthcare provider that is prescribing your Eliquis.    What do you do if you miss a dose? If a dose of ELIQUIS is not taken at the scheduled time, take it as soon as possible on the same day and twice-daily administration should be resumed. The dose should not be doubled to make up for a missed dose.  Important Safety Information A possible side effect of Eliquis is bleeding. You should call your healthcare provider right away if you experience any of the following: ? Bleeding from an injury or your nose that does not stop. ? Unusual colored urine (red or dark brown) or unusual colored stools (red or black). ? Unusual bruising  for unknown reasons. ? A serious fall or if you hit your head (even if there is no bleeding).  Some medicines may interact with Eliquis and might increase your risk of bleeding or clotting while on Eliquis. To help avoid this, consult your healthcare provider or pharmacist prior to using any new prescription or non-prescription medications, including herbals, vitamins, non-steroidal anti-inflammatory drugs (NSAIDs) and supplements.  This website has more information on Eliquis (apixaban): http://www.eliquis.com/eliquis/home

## 2016-10-31 NOTE — Progress Notes (Signed)
[facility] [place] [facility]   PATIENT NAME: Aaron HugerRichard Villanueva    MR#:  [id number]  DATE OF BIRTH:  July 24, 1951  SUBJECTIVE:acute respiratory failure with hypoxia secondary to pulmonary embolus. And  right leg DVT. No further hypoxia, patient eager to go home. Echocardiogram is pending.   CHIEF COMPLAINT:   Chief Complaint  Patient presents with  . Shortness of Breath    REVIEW OF SYSTEMS:   ROS CONSTITUTIONAL: No fever, fatigue or weakness.  EYES: No blurred or double vision.  EARS, NOSE, AND THROAT: No tinnitus or ear pain.  RESPIRATORY: No cough, shortness of breath, wheezing or hemoptysis.  CARDIOVASCULAR: No chest pain, orthopnea, edema.  GASTROINTESTINAL: No nausea, vomiting, diarrhea or abdominal pain.  GENITOURINARY: No dysuria, hematuria.  ENDOCRINE: No polyuria, nocturia,  HEMATOLOGY: No anemia, easy bruising or bleeding SKIN: No rash or lesion. MUSCULOSKELETAL: No joint pain or arthritis.   NEUROLOGIC: No tingling, numbness, weakness.  PSYCHIATRY: No anxiety or depression.   DRUG ALLERGIES:  No Known Allergies  VITALS:  Blood pressure 120/78, pulse 72, temperature 98.2 F (36.8 C), temperature source Oral, resp. rate (!) 22, height 5\' 10"  (1.778 m), weight 86.3 kg (190 lb 4.8 oz), SpO2 91 %.  PHYSICAL EXAMINATION:  GENERAL:  66 y.o.-year-old patient lying in the bed with no acute distress.  EYES: Pupils equal, round, reactive to light and accommodation. No scleral icterus. Extraocular muscles intact.  HEENT: Head atraumatic, normocephalic. Oropharynx and nasopharynx clear.  NECK:  Supple, no jugular venous distention. No thyroid enlargement, no tenderness.  LUNGS: Normal breath sounds bilaterally, no wheezing, rales,rhonchi or crepitation. No use of accessory muscles of respiration.  CARDIOVASCULAR: S1, S2 normal. No murmurs, rubs, or gallops.  ABDOMEN: Soft, nontender, nondistended. Bowel sounds present. No organomegaly or mass.   EXTREMITIES: No pedal edema, cyanosis, or clubbing.  NEUROLOGIC: Cranial nerves II through XII are intact. Muscle strength 5/5 in all extremities. Sensation intact. Gait not checked.  PSYCHIATRIC: The patient is alert and oriented x 3.  SKIN: No obvious rash, lesion, or ulcer.    LABORATORY PANEL:   CBC  Recent Labs Lab 10/31/16 0556  WBC 11.4*  HGB 14.2  HCT 40.7  PLT 153   ------------------------------------------------------------------------------------------------------------------  Chemistries   Recent Labs Lab 10/30/16 0544  NA 137  K 4.1  CL 103  CO2 25  GLUCOSE 105*  BUN 11  CREATININE 0.77  CALCIUM 8.7*   ------------------------------------------------------------------------------------------------------------------  Cardiac Enzymes  Recent Labs Lab 10/29/16 2007  TROPONINI 0.32*   ------------------------------------------------------------------------------------------------------------------  RADIOLOGY:  [place] Venous Img Lower Bilateral  Result Date: 10/29/2016 CLINICAL DATA:  Shortness of breath. EXAM: BILATERAL LOWER EXTREMITY VENOUS DOPPLER ULTRASOUND TECHNIQUE: Gray-scale sonography with graded compression, as well as color Doppler and duplex ultrasound were performed to evaluate the lower extremity deep venous systems from the level of the common femoral vein and including the common femoral, femoral, profunda femoral, popliteal and calf veins including the posterior tibial, peroneal and gastrocnemius veins when visible. The superficial great saphenous vein was also interrogated. Spectral Doppler was utilized to evaluate flow at rest and with distal augmentation maneuvers in the common femoral, femoral and popliteal veins. COMPARISON:  No prior. FINDINGS: RIGHT LOWER EXTREMITY Common Femoral Vein: No evidence of thrombus. Normal compressibility, respiratory phasicity and response to augmentation. Saphenofemoral Junction: No evidence of thrombus.  Normal compressibility and flow on color Doppler imaging. Profunda Femoral Vein: No evidence of thrombus. Normal compressibility and flow on color Doppler imaging. Femoral Vein: Nonocclusive  thrombus. Popliteal Vein: Nonocclusive thrombus. Calf Veins: No evidence of thrombus. Normal compressibility and flow on color Doppler imaging. Superficial Great Saphenous Vein: Nonocclusive thrombus in the lesser saphenous vein. Other Findings:  Slow flow noted bilaterally. LEFT LOWER EXTREMITY Common Femoral Vein: No evidence of thrombus. Normal compressibility, respiratory phasicity and response to augmentation. Saphenofemoral Junction: No evidence of thrombus. Normal compressibility and flow on color Doppler imaging. Profunda Femoral Vein: No evidence of thrombus. Normal compressibility and flow on color Doppler imaging. Femoral Vein: No evidence of thrombus. Normal compressibility, respiratory phasicity and response to augmentation. Popliteal Vein: No evidence of thrombus. Normal compressibility, respiratory phasicity and response to augmentation. Calf Veins: No evidence of thrombus. Normal compressibility and flow on color Doppler imaging. Superficial Great Saphenous Vein: No evidence of thrombus. Normal compressibility and flow on color Doppler imaging. Other Findings:  Slow flow noted bilaterally. IMPRESSION: Positive study for right lower extremity deep venous and superficial venous thrombosis with nonocclusive thrombus noted in the right femoral, popliteal vein, and lesser saphenous veins. Electronically Signed   By: Maisie Fus  Register   On: 10/29/2016 15:39    EKG:   Orders placed or performed during the hospital encounter of 10/29/16  . EKG 12-Lead  . EKG 12-Lead  . ED EKG within 10 minutes  . ED EKG within 10 minutes    ASSESSMENT AND PLAN:  #1 acute respiratory failure with hypoxia secondary to acute pulmonary embolus. The source is likely from right leg DVT. Started   eliquis  today. No further hypoxia.  Discharge home with  Eliquis.. Discussed the risk and benefits of anticoagulation. This is  Unprovoked could DVT, PE so he may need anticoagulation for lifetime. Follow-up with primary doctor in 1 week.    2.slightly elevated troponin's, likely due to PE: Check echocardiogram.  Please call with echo results for discharge orders. D/w Rn and patient 4.heavy tobacco abuse, 50-pack-years history;conselled to quit.  D/2 wife   All the records are reviewed and case discussed with Care Management/Social Workerr. Management plans discussed with the patient, family and they are in agreement.  CODE STATUS:full  TOTAL TIME TAKING CARE OF THIS PATIENT: 35 minutes.    likley d/c today,pending echo result.  Katha Hamming M.D on 10/31/2016 at 12:47 PM  Between 7am to 6pm - Pager - [phone]  After 6pm go to [email or web address] - password [email or web address]  [facility]  Office  [phone]  CC: Primary care physician; Lauro Regulus., MD   Note: This dictation was prepared with Dragon dictation along with smaller phrase technology. Any transcriptional errors that result from this process are unintentional.

## 2016-11-01 ENCOUNTER — Inpatient Hospital Stay (HOSPITAL_COMMUNITY)
Admit: 2016-11-01 | Discharge: 2016-11-01 | Disposition: A | Discharge status: Home / Self Care | Payer: Medicare Other | Attending: Specialist | Admitting: Specialist

## 2016-11-01 DIAGNOSIS — R7989 Other specified abnormal findings of blood chemistry: Secondary | ICD-10-CM

## 2016-11-01 LAB — CBC
HEMATOCRIT: 42.6 % (ref 40.0–52.0)
Hemoglobin: 14.4 g/dL (ref 13.0–18.0)
MCH: 31.6 pg (ref 26.0–34.0)
MCHC: 33.9 g/dL (ref 32.0–36.0)
MCV: 93.3 fL (ref 80.0–100.0)
PLATELETS: 182 10*3/uL (ref 150–440)
RBC: 4.57 MIL/uL (ref 4.40–5.90)
RDW: 14.1 % (ref 11.5–14.5)
WBC: 12.3 10*3/uL — ABNORMAL HIGH (ref 3.8–10.6)

## 2016-11-01 LAB — ECHOCARDIOGRAM COMPLETE
Height: 70 in
Weight: 3044.8 oz

## 2016-11-01 LAB — HEPARIN LEVEL (UNFRACTIONATED): Heparin Unfractionated: 3.4 IU/mL — ABNORMAL HIGH (ref 0.30–0.70)

## 2016-11-01 NOTE — Progress Notes (Signed)
*  PRELIMINARY RESULTS* Echocardiogram 2D Echocardiogram has been performed.  Cristela BlueHege, Eunie Lawn 11/01/2016, 9:55 AM

## 2016-11-01 NOTE — Care Management (Signed)
Admitted with pulmonary embolus. Provided patient with Eliquis coupons. Not requiring supplemental 02 Independent in all adls, denies issues accessing medical care, obtaining medications or with transportation.  Current with  PCP.  Confirms he has pharmacy coverage. No discharge needs identified at present by care manager or members of care team

## 2016-11-04 NOTE — Discharge Summary (Signed)
Aaron Villanueva, is a 66 y.o. male  DOB 1950/12/23  MRN [id number].  Admission date:  10/29/2016  Admitting Physician  Houston Siren, MD  Discharge Date:  11/01/2016   Primary MD  Lauro Regulus., MD  Recommendations for primary care physician for things to follow:  Follow up with PCP  In one  week.   Admission Diagnosis  Shortness of breath [R06.02] Other acute pulmonary embolism without acute cor pulmonale (HCC) [I26.99]   Discharge Diagnosis  Shortness of breath [R06.02] Other acute pulmonary embolism without acute cor pulmonale (HCC) [I26.99]    Active Problems:   Pulmonary embolus [facility])      Past Medical History:  Diagnosis Date  . COPD (chronic obstructive pulmonary disease) (HCC)   . Tobacco abuse     History reviewed. No pertinent surgical history.     History of present illness and  Hospital Course:     Kindly see H&P for history of present illness and admission details, please review complete Labs, Consult reports and Test reports for all details in brief  HPI  from the history and physical done on the day of admission 66 y.o. male with a known history of COPD, ongoing tobacco abuse who presents to the hospital due to shortness of breath , CT chest showed pulmonary embolus. Admitted for the same.   Hospital Course    #1. Acute respiratory failure with hypoxia secondary to pulmonary embolus: Patient received oxygen, started on IV heparin drip. Patient lower extremity ultrasound showed extensive right leg DVT. Patient started on Eliquis ,discussed the risk and benefits of anticoagulation. Discharge home with  Eliquis. O2 saturation 87% on room air when he came but improved, and patient oxygen saturation 94% on room air. atdischarge.Patient echocardiogram showed EF more than 55%. She  Has unprovoked   DVT and PE: Patient needs anticoagulation now for lifetime. Patient is to follow-up with primary doctor.    Discharge Condition: stable   Follow UP  Follow-up Information    Lauro Regulus., MD Follow up in 1 week(s).   Specialty:  Internal Medicine Contact information: [address] [facility] [place] [address] [phone]             Discharge Instructions  and  Discharge Medications      Allergies as of 11/01/2016   No Known Allergies     Medication List    TAKE these medications   apixaban 5 MG Tabs tablet Commonly known as:  ELIQUIS Take 2 tablets (10 mg total) by mouth 2 (two) times daily.         Diet and Activity recommendation: See Discharge Instructions above   Consults obtained -none   Major procedures and Radiology Reports - PLEASE review detailed and final reports for all details, in brief -     Dg Chest 2 View  Result Date: 10/29/2016 CLINICAL DATA:  Shortness of breath . EXAM: CHEST  2 VIEW COMPARISON:  08/23/2009 FINDINGS: Cardiomegaly with mild pulmonary vascular prominence and interstitial prominence consistent mild CHF. Pneumonitis cannot be excluded. Small right pleural effusion. No pneumothorax . IMPRESSION: Cardiomegaly with bilateral from interstitial prominence of small right pleural effusion consistent CHF. Mild pneumonitis cannot be excluded. Electronically Signed   By: Maisie Fus  Register   On: 10/29/2016 08:25   Ct Angio Chest Pe W Or Wo Contrast  Result Date: 10/29/2016 CLINICAL DATA:  66 year old male with cough and congestion for 2 weeks. Acute onset shortness of breath today. Initial  encounter. EXAM: CT ANGIOGRAPHY CHEST WITH CONTRAST TECHNIQUE: Multidetector CT imaging of the chest was performed using the standard protocol during bolus administration of intravenous contrast. Multiplanar CT image reconstructions and MIPs were obtained to evaluate the vascular anatomy. CONTRAST:  75 mL Isovue 370  COMPARISON:  Chest radiographs 0806 hours today. FINDINGS: Cardiovascular: Good contrast bolus timing in the pulmonary arterial tree. Positive for bilateral pulmonary emboli affecting all lobes. There is right main pulmonary artery thrombus and extensive perihilar thrombus. RV / LV ratio = 1.3, and is associated with paradoxical bowing of the interventricular septum into the left ventricle (series 5, image 197). No saddle embolus. No pericardial effusion. Mild cardiomegaly. Negative visualized aorta. Mediastinum/Nodes: Prominent azygos arch. No mediastinal lymphadenopathy. Lungs/Pleura: Major airways are patent. Scattered paraseptal emphysema but widespread abnormal subpleural reticular opacity in both lungs. In the costophrenic angles and lower lobes this most resembles developing pulmonary fibrosis. Superimposed right lower lobe lateral basal segment and other dependent pulmonary ground-glass opacity suspicious for some developing pulmonary infarcts in this clinical setting. No consolidation. Trace right pleural effusion. Upper Abdomen: Simple fluid density 3.8 cm circumscribed cyst in the hepatic dome. Otherwise negative visualized liver, gallbladder, spleen, pancreas, right adrenal gland, and bowel in the upper abdomen. Musculoskeletal: No acute osseous abnormality identified. Review of the MIP images confirms the above findings. IMPRESSION: 1. Positive for acute PE with CT evidence of right heart strain (RV/LV Ratio = 1.3) consistent with at least submassive (intermediate risk) PE. The presence of right heart strain has been associated with an increased risk of morbidity and mortality. Please activate Code PE by paging [phone]. 2. 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 Dependent and peripheral ground-glass opacity in the right lower lobe suspicious for developing pulmonary infarcts. Trace right pleural effusion. 3. Critical Value/emergent results were called by telephone at the time of interpretation on  10/29/2016 at 9:16 am to Dr. Lacretia NicksBRIAN QUIGLEY , who verbally acknowledged these results. Electronically Signed   By: Odessa FlemingH  Hall M.D.   On: 10/29/2016 09:18   [place] Venous Img Lower Bilateral  Result Date: 10/29/2016 CLINICAL DATA:  Shortness of breath. EXAM: BILATERAL LOWER EXTREMITY VENOUS DOPPLER ULTRASOUND TECHNIQUE: Gray-scale sonography with graded compression, as well as color Doppler and duplex ultrasound were performed to evaluate the lower extremity deep venous systems from the level of the common femoral vein and including the common femoral, femoral, profunda femoral, popliteal and calf veins including the posterior tibial, peroneal and gastrocnemius veins when visible. The superficial great saphenous vein was also interrogated. Spectral Doppler was utilized to evaluate flow at rest and with distal augmentation maneuvers in the common femoral, femoral and popliteal veins. COMPARISON:  No prior. FINDINGS: RIGHT LOWER EXTREMITY Common Femoral Vein: No evidence of thrombus. Normal compressibility, respiratory phasicity and response to augmentation. Saphenofemoral Junction: No evidence of thrombus. Normal compressibility and flow on color Doppler imaging. Profunda Femoral Vein: No evidence of thrombus. Normal compressibility and flow on color Doppler imaging. Femoral Vein: Nonocclusive thrombus. Popliteal Vein: Nonocclusive thrombus. Calf Veins: No evidence of thrombus. Normal compressibility and flow on color Doppler imaging. Superficial Great Saphenous Vein: Nonocclusive thrombus in the lesser saphenous vein. Other Findings:  Slow flow noted bilaterally. LEFT LOWER EXTREMITY Common Femoral Vein: No evidence of thrombus. Normal compressibility, respiratory phasicity and response to augmentation. Saphenofemoral Junction: No evidence of thrombus. Normal compressibility and flow on color Doppler imaging. Profunda Femoral Vein: No evidence of thrombus. Normal compressibility and flow on color Doppler imaging. Femoral  Vein: No evidence of thrombus. Normal compressibility,  respiratory phasicity and response to augmentation. Popliteal Vein: No evidence of thrombus. Normal compressibility, respiratory phasicity and response to augmentation. Calf Veins: No evidence of thrombus. Normal compressibility and flow on color Doppler imaging. Superficial Great Saphenous Vein: No evidence of thrombus. Normal compressibility and flow on color Doppler imaging. Other Findings:  Slow flow noted bilaterally. IMPRESSION: Positive study for right lower extremity deep venous and superficial venous thrombosis with nonocclusive thrombus noted in the right femoral, popliteal vein, and lesser saphenous veins. Electronically Signed   By: Maisie Fus  Register   On: 10/29/2016 15:39    Micro Results     No results found for this or any previous visit (from the past 240 hour(s)).     Today   Subjective:   Aaron Villanueva today has no headache,no chest abdominal pain,no new weakness tingling or numbness, feels much better wants to go home today.  Objective:   Blood pressure (!) 119/92, pulse 71, temperature 98.6 F (37 C), temperature source Oral, resp. rate 18, height 5\' 10"  (1.778 m), weight 86.3 kg (190 lb 4.8 oz), SpO2 92 %.  No intake or output data in the 24 hours ending 11/04/16 1827  Exam Awake Alert, Oriented x 3, No new F.N deficits, Normal affect [place].AT,PERRAL Supple Neck,No JVD, No cervical lymphadenopathy appriciated.  Symmetrical Chest wall movement, Good air movement bilaterally, CTAB RRR,No Gallops,Rubs or new Murmurs, No Parasternal Heave +ve B.Sounds, Abd Soft, Non tender, No organomegaly appriciated, No rebound -guarding or rigidity. No Cyanosis, Clubbing or edema, No new Rash or bruise  Data Review   CBC w Diff:  Lab Results  Component Value Date   WBC 12.3 (H) 11/01/2016   HGB 14.4 11/01/2016   HCT 42.6 11/01/2016   PLT 182 11/01/2016    CMP:  Lab Results  Component Value Date   NA 137 10/30/2016    K 4.1 10/30/2016   CL 103 10/30/2016   CO2 25 10/30/2016   BUN 11 10/30/2016   CREATININE 0.77 10/30/2016  . Pt expressed dissatisfaction with room  Cleaning and claims his room not cleaned during his stay and he is afraid he will catch infection  Total Time in preparing paper work, data evaluation and todays exam - 35 minutes  Alphonza Tramell M.D on 11/01/2016 at 6:27 PM    Note: This dictation was prepared with Dragon dictation along with smaller phrase technology. Any transcriptional errors that result from this process are unintentional.

## 2017-09-27 ENCOUNTER — Other Ambulatory Visit: Payer: Self-pay

## 2017-09-27 ENCOUNTER — Ambulatory Visit
Admission: EM | Admit: 2017-09-27 | Discharge: 2017-09-27 | Disposition: A | Discharge status: Home / Self Care | Payer: Medicare Other | Attending: Family Medicine | Admitting: Family Medicine

## 2017-09-27 DIAGNOSIS — M79672 Pain in left foot: Secondary | ICD-10-CM

## 2017-09-27 HISTORY — DX: Other pulmonary embolism without acute cor pulmonale: I26.99

## 2017-09-27 NOTE — ED Triage Notes (Signed)
Patient complains of left foot pain that started when he got up in the middle of the night last night. Patient states that he was walking and he felt like his foot was going to "give way". Patient denies any known injury to foot and states that pain is at the top of his foot.

## 2017-09-27 NOTE — Discharge Instructions (Signed)
Advised to keep Ace wrap on x 7-10 days. Tylenol as needed for pain. Avoid weight bearing and prolong standing.  If no improvement or Sx worsens return to clinic for re evaluation

## 2017-09-27 NOTE — ED Provider Notes (Signed)
[facility]    CSN: [id number] Arrival date & time: 09/27/17  [id number]     History   Chief Complaint Chief Complaint  Patient presents with  . Foot Pain    left    HPI Massie KluverRichard W Borowiak is a 67 y.o. male presented to clinic with CC of left foot pain, dorsum aspect. Pt reports woke up with this pain in the morning. Denies injury/trauma/fall/ Mid/lateral dorsum aspect of left foot tender to palpation. No visible swelling, bruising, deformity noted on exam. Pedal pulse intact. Gait steady.  HPI  Past Medical History:  Diagnosis Date  . COPD (chronic obstructive pulmonary disease) (HCC)   . PE (pulmonary thromboembolism) (HCC)   . Tobacco abuse     Patient Active Problem List   Diagnosis Date Noted  . Pulmonary embolus (HCC) 10/29/2016    Past Surgical History:  Procedure Laterality Date  . NO PAST SURGERIES         Home Medications    Prior to Admission medications   Medication Sig Start Date End Date Taking? Authorizing Provider  apixaban (ELIQUIS) 5 MG TABS tablet Take 2 tablets (10 mg total) by mouth 2 (two) times daily. 10/31/16  Yes Katha HammingKonidena, Snehalatha, MD    Family History Family History  Problem Relation Age of Onset  . Lung cancer Mother     Social History Social History   Tobacco Use  . Smoking status: Former Smoker    Packs/day: 1.00    Years: 50.00    Pack years: 50.00    Types: Cigarettes    Last attempt to quit: 10/28/2016    Years since quitting: 0.9  . Smokeless tobacco: Never Used  Substance Use Topics  . Alcohol use: No  . Drug use: No     Allergies   Patient has no known allergies.   Review of Systems Review of Systems  Constitutional: Negative.   HENT: Negative.   Respiratory: Negative.   Cardiovascular: Negative.   Musculoskeletal: Positive for arthralgias (left foot pain ).  Skin: Negative for color change.     Physical Exam Triage Vital Signs ED Triage Vitals  Enc Vitals Group     BP 09/27/17 0902 (!)  142/86     Pulse Rate 09/27/17 0902 66     Resp 09/27/17 0902 16     Temp 09/27/17 0902 98.1 F (36.7 C)     Temp Source 09/27/17 0902 Oral     SpO2 09/27/17 0902 99 %     Weight 09/27/17 0859 220 lb (99.8 kg)     Height 09/27/17 0859 5\' 10"  (1.778 m)     Head Circumference --      Peak Flow --      Pain Score 09/27/17 0900 4     Pain Loc --      Pain Edu? --      Excl. in GC? --    No data found.  Updated Vital Signs BP (!) 142/86 (BP Location: Left Arm)   Pulse 66   Temp 98.1 F (36.7 C) (Oral)   Resp 16   Ht 5\' 10"  (1.778 m)   Wt 220 lb (99.8 kg)   SpO2 99%   BMI 31.57 kg/m   Visual Acuity Right Eye Distance:   Left Eye Distance:   Bilateral Distance:    Right Eye Near:   Left Eye Near:    Bilateral Near:     Physical Exam  Constitutional: He is oriented to person, place, and  time. He appears well-developed and well-nourished.  Eyes: Pupils are equal, round, and reactive to light.  Cardiovascular: Normal rate, regular rhythm and normal heart sounds.  Pulmonary/Chest: Effort normal and breath sounds normal.  Musculoskeletal: He exhibits tenderness (left foot pain, tender to palaption to mid dorsum aspect of left foot. No visible swelling, bruising noted. ). He exhibits no edema or deformity.  Neurological: He is alert and oriented to person, place, and time.     UC Treatments / Results  Labs (all labs ordered are listed, but only abnormal results are displayed) Labs Reviewed - No data to display  EKG  EKG Interpretation None       Radiology No results found.  Procedures Procedures (including critical care time)  Medications Ordered in UC Medications - No data to display   Initial Impression / Assessment and Plan / UC Course  I have reviewed the triage vital signs and the nursing notes.  Pertinent labs & imaging results that were available during my care of the patient were reviewed by me and considered in my medical decision making (see  chart for details).    Keep Ace wrap on. Pt refused ace wrap in clinic. States have one at home. Will apply as sson as get home.  Final Clinical Impressions(s) / UC Diagnoses   Final diagnoses:  Foot pain, left    ED Discharge Orders    None       Controlled Substance Prescriptions [place] Controlled Substance Registry consulted? Not Applicable   Reinaldo Raddle, NP 09/27/17 [phone]

## 2018-05-01 ENCOUNTER — Encounter: Payer: Self-pay | Admitting: Emergency Medicine

## 2018-05-01 ENCOUNTER — Ambulatory Visit
Admission: EM | Admit: 2018-05-01 | Discharge: 2018-05-01 | Disposition: A | Discharge status: Home / Self Care | Payer: Commercial Managed Care - PPO | Attending: Family Medicine | Admitting: Family Medicine

## 2018-05-01 ENCOUNTER — Other Ambulatory Visit: Payer: Self-pay

## 2018-05-01 DIAGNOSIS — R21 Rash and other nonspecific skin eruption: Secondary | ICD-10-CM

## 2018-05-01 DIAGNOSIS — W57XXXA Bitten or stung by nonvenomous insect and other nonvenomous arthropods, initial encounter: Secondary | ICD-10-CM | POA: Diagnosis not present

## 2018-05-01 MED ORDER — DOXYCYCLINE HYCLATE 100 MG PO TABS: 100.0000 mg | ORAL_TABLET | Freq: Two times a day (BID) | ORAL | 0 refills | Status: DC

## 2018-05-01 NOTE — ED Provider Notes (Signed)
[facility]    CSN: [id number] Arrival date & time: 05/01/18  0850     History   Chief Complaint Chief Complaint  Patient presents with  . Rash    HPI Aaron Villanueva is a 67 y.o. male.   The history is provided by the patient.  Rash  Location:  Leg Leg rash location:  R lower leg Quality: itchiness and redness   Severity:  Moderate Onset quality:  Sudden Duration:  3 days Timing:  Constant Progression:  Worsening Chronicity:  New Context: insect bite/sting (exposure to multiple tick bites)   Context: not animal contact, not chemical exposure, not diapers, not eggs, not exposure to similar rash, not food, not hot tub use, not medications, not new detergent/soap, not nuts, not plant contact, not pollen, not pregnancy, not sick contacts and not sun exposure   Relieved by:  Nothing Ineffective treatments: over the counter hydrocortisone. Associated symptoms: no abdominal pain, no diarrhea, no fatigue, no fever, no headaches, no hoarse voice, no induration, no joint pain, no myalgias, no nausea, no periorbital edema, no shortness of breath, no sore throat, no throat swelling, no tongue swelling, no URI, not vomiting and not wheezing     Past Medical History:  Diagnosis Date  . COPD (chronic obstructive pulmonary disease) (HCC)   . PE (pulmonary thromboembolism) (HCC)   . Tobacco abuse     Patient Active Problem List   Diagnosis Date Noted  . Pulmonary embolus (HCC) 10/29/2016    Past Surgical History:  Procedure Laterality Date  . NO PAST SURGERIES         Home Medications    Prior to Admission medications   Medication Sig Start Date End Date Taking? Authorizing Provider  apixaban (ELIQUIS) 5 MG TABS tablet Take 2 tablets (10 mg total) by mouth 2 (two) times daily. 10/31/16  Yes Katha Hamming, MD  doxycycline (VIBRA-TABS) 100 MG tablet Take 1 tablet (100 mg total) by mouth 2 (two) times daily. 05/01/18   Payton Mccallum, MD    Family  History Family History  Problem Relation Age of Onset  . Lung cancer Mother     Social History Social History   Tobacco Use  . Smoking status: Former Smoker    Packs/day: 1.00    Years: 50.00    Pack years: 50.00    Types: Cigarettes    Last attempt to quit: 10/28/2016    Years since quitting: 1.5  . Smokeless tobacco: Never Used  Substance Use Topics  . Alcohol use: No  . Drug use: No     Allergies   Patient has no known allergies.   Review of Systems Review of Systems  Constitutional: Negative for fatigue and fever.  HENT: Negative for hoarse voice and sore throat.   Respiratory: Negative for shortness of breath and wheezing.   Gastrointestinal: Negative for abdominal pain, diarrhea, nausea and vomiting.  Musculoskeletal: Negative for arthralgias and myalgias.  Skin: Positive for rash.  Neurological: Negative for headaches.     Physical Exam Triage Vital Signs ED Triage Vitals  Enc Vitals Group     BP 05/01/18 0902 128/87     Pulse Rate 05/01/18 0902 (!) 58     Resp 05/01/18 0902 16     Temp 05/01/18 0902 98.4 F (36.9 C)     Temp Source 05/01/18 0902 Oral     SpO2 05/01/18 0902 97 %     Weight 05/01/18 0901 220 lb (99.8 kg)  Height 05/01/18 0901 5\' 10"  (1.778 m)     Head Circumference --      Peak Flow --      Pain Score 05/01/18 0901 0     Pain Loc --      Pain Edu? --      Excl. in GC? --    No data found.  Updated Vital Signs BP 128/87 (BP Location: Left Arm)   Pulse (!) 58   Temp 98.4 F (36.9 C) (Oral)   Resp 16   Ht 5\' 10"  (1.778 m)   Wt 220 lb (99.8 kg)   SpO2 97%   BMI 31.57 kg/m   Visual Acuity Right Eye Distance:   Left Eye Distance:   Bilateral Distance:    Right Eye Near:   Left Eye Near:    Bilateral Near:     Physical Exam  Constitutional: He appears well-developed and well-nourished. No distress.  Skin: Rash noted. Rash is maculopapular. He is not diaphoretic.     Nursing note and vitals reviewed.    UC  Treatments / Results  Labs (all labs ordered are listed, but only abnormal results are displayed) Labs Reviewed - No data to display  EKG None  Radiology No results found.  Procedures Procedures (including critical care time)  Medications Ordered in UC Medications - No data to display  Initial Impression / Assessment and Plan / UC Course  I have reviewed the triage vital signs and the nursing notes.  Pertinent labs & imaging results that were available during my care of the patient were reviewed by me and considered in my medical decision making (see chart for details).      Final Clinical Impressions(s) / UC Diagnoses   Final diagnoses:  Rash  Multiple insect bites    ED Prescriptions    Medication Sig Dispense Auth. Provider   doxycycline (VIBRA-TABS) 100 MG tablet Take 1 tablet (100 mg total) by mouth 2 (two) times daily. 20 tablet Payton Mccallumonty, Josephine Wooldridge, MD     1. diagnosis reviewed with patient 2. rx as per orders above; reviewed possible side effects, interactions, risks and benefits  3. Recommend supportive treatment with otc antihistamines prn itching  4. Follow-up prn if symptoms worsen or don't improve   Controlled Substance Prescriptions [place] Controlled Substance Registry consulted? Not Applicable   Payton Mccallumonty, Elishah Ashmore, MD 05/01/18 1221

## 2018-05-01 NOTE — ED Triage Notes (Signed)
Patient c/o itchy, red rash to right lower leg that started Saturday. Has been using OTC Hydrocortisone cream with no relief.

## 2018-05-01 NOTE — Discharge Instructions (Signed)
Benadryl as needed

## 2019-01-05 ENCOUNTER — Other Ambulatory Visit: Payer: Self-pay

## 2019-01-05 ENCOUNTER — Ambulatory Visit
Admission: EM | Admit: 2019-01-05 | Discharge: 2019-01-05 | Disposition: A | Discharge status: Home / Self Care | Payer: Commercial Managed Care - PPO | Attending: Family Medicine | Admitting: Family Medicine

## 2019-01-05 ENCOUNTER — Encounter: Payer: Self-pay | Admitting: Emergency Medicine

## 2019-01-05 ENCOUNTER — Ambulatory Visit (INDEPENDENT_AMBULATORY_CARE_PROVIDER_SITE_OTHER): Payer: Commercial Managed Care - PPO

## 2019-01-05 DIAGNOSIS — M7750 Other enthesopathy of unspecified foot: Secondary | ICD-10-CM | POA: Diagnosis not present

## 2019-01-05 DIAGNOSIS — M79672 Pain in left foot: Secondary | ICD-10-CM | POA: Diagnosis not present

## 2019-01-05 MED ORDER — PREDNISONE 20 MG PO TABS: ORAL_TABLET | ORAL | 0 refills | Status: DC

## 2019-01-05 MED ORDER — HYDROCODONE-ACETAMINOPHEN 5-325 MG PO TABS: ORAL_TABLET | ORAL | 0 refills | Status: DC

## 2019-01-05 NOTE — ED Triage Notes (Signed)
Patient c/o pain on the side and top of his left foot since Tuesday.  Patient denies injury or fall.  Patient reports history of gout.

## 2019-01-05 NOTE — Discharge Instructions (Signed)
Rest, ice, elevation °

## 2019-01-27 NOTE — ED Provider Notes (Signed)
[facility]    CSN: [id number] Arrival date & time: 01/05/19  [id number]     History   Chief Complaint Chief Complaint  Patient presents with  . Foot Pain    left    HPI Aaron Villanueva is a 68 y.o. male.   68 yo male with a c/o left foot pain on the side and top of the foot. Denies any injury, fall, redness, fevers, chills, rash.   The history is provided by the patient.  Foot Pain     Past Medical History:  Diagnosis Date  . COPD (chronic obstructive pulmonary disease) (HCC)   . PE (pulmonary thromboembolism) (HCC)   . Tobacco abuse     Patient Active Problem List   Diagnosis Date Noted  . Pulmonary embolus (HCC) 10/29/2016    Past Surgical History:  Procedure Laterality Date  . NO PAST SURGERIES         Home Medications    Prior to Admission medications   Medication Sig Start Date End Date Taking? Authorizing Provider  apixaban (ELIQUIS) 5 MG TABS tablet Take 2 tablets (10 mg total) by mouth 2 (two) times daily. 10/31/16  Yes Katha HammingKonidena, Snehalatha, MD  colchicine 0.6 MG tablet Take 2 tablets (1.2mg ) by mouth at first sign of gout flare followed by 1 tablet (0.6mg ) after 1 hour. (Max 1.8mg  within 1 hour) 10/25/18  Yes [provider]  MITIGARE 0.6 MG CAPS  10/25/18  Yes [provider]  sildenafil (VIAGRA) 100 MG tablet Take by mouth. 07/06/16  Yes [provider]  doxycycline (VIBRA-TABS) 100 MG tablet Take 1 tablet (100 mg total) by mouth 2 (two) times daily. 05/01/18   Payton Mccallumonty, Era Parr, MD  HYDROcodone-acetaminophen (NORCO/VICODIN) 5-325 MG tablet 1-2 tabs po bid prn 01/05/19   Payton Mccallumonty, Phala Schraeder, MD  predniSONE (DELTASONE) 20 MG tablet 3 tabs po once day 1, then 2 tabs po qd x 2 days, then 1 tab po qd x 2 days, then half a tab po qd x 2 days 01/05/19   Payton Mccallumonty, Daxtyn Rottenberg, MD    Family History Family History  Problem Relation Age of Onset  . Lung cancer Mother     Social History Social History   Tobacco Use  . Smoking status:  Former Smoker    Packs/day: 1.00    Years: 50.00    Pack years: 50.00    Types: Cigarettes    Last attempt to quit: 10/28/2016    Years since quitting: 2.2  . Smokeless tobacco: Never Used  Substance Use Topics  . Alcohol use: No  . Drug use: No     Allergies   Percocet [oxycodone-acetaminophen]   Review of Systems Review of Systems   Physical Exam Triage Vital Signs ED Triage Vitals  Enc Vitals Group     BP 01/05/19 1007 136/86     Pulse Rate 01/05/19 1007 60     Resp 01/05/19 1007 16     Temp 01/05/19 1007 98.4 F (36.9 C)     Temp Source 01/05/19 1007 Oral     SpO2 01/05/19 1007 98 %     Weight 01/05/19 1003 220 lb (99.8 kg)     Height 01/05/19 1003 5\' 10"  (1.778 m)     Head Circumference --      Peak Flow --      Pain Score 01/05/19 1003 3     Pain Loc --      Pain Edu? --      Excl. in  GC? --    No data found.  Updated Vital Signs BP 136/86 (BP Location: Left Arm)   Pulse 60   Temp 98.4 F (36.9 C) (Oral)   Resp 16   Ht 5\' 10"  (1.778 m)   Wt 99.8 kg   SpO2 98%   BMI 31.57 kg/m   Visual Acuity Right Eye Distance:   Left Eye Distance:   Bilateral Distance:    Right Eye Near:   Left Eye Near:    Bilateral Near:     Physical Exam Vitals signs and nursing note reviewed.  Constitutional:      General: He is not in acute distress.    Appearance: He is not toxic-appearing or diaphoretic.  Musculoskeletal:     Left foot: Normal range of motion and normal capillary refill. Tenderness (diffuse on lateral mid foot) present. No swelling, crepitus, deformity or laceration.     Comments: Neurovascularly intact  Neurological:     Mental Status: He is alert.      UC Treatments / Results  Labs (all labs ordered are listed, but only abnormal results are displayed) Labs Reviewed - No data to display  EKG None  Radiology No results found.  Procedures Procedures (including critical care time)  Medications Ordered in UC Medications - No data  to display  Initial Impression / Assessment and Plan / UC Course  I have reviewed the triage vital signs and the nursing notes.  Pertinent labs & imaging results that were available during my care of the patient were reviewed by me and considered in my medical decision making (see chart for details).      Final Clinical Impressions(s) / UC Diagnoses   Final diagnoses:  Tendonitis of foot     Discharge Instructions     Rest, ice, elevation    ED Prescriptions    Medication Sig Dispense Auth. Provider   predniSONE (DELTASONE) 20 MG tablet 3 tabs po once day 1, then 2 tabs po qd x 2 days, then 1 tab po qd x 2 days, then half a tab po qd x 2 days 10 tablet Tammee Thielke, Pamala Hurry, MD   HYDROcodone-acetaminophen (NORCO/VICODIN) 5-325 MG tablet 1-2 tabs po bid prn 6 tablet Seena Ritacco, Pamala Hurry, MD      1. x-ray results and diagnosis reviewed with patient/parent/guardian/family 2. rx as per orders above; reviewed possible side effects, interactions, risks and benefits  3. Recommend supportive treatment as above 4. Follow-up prn if symptoms worsen or don't improve Controlled Substance Prescriptions [place] Controlled Substance Registry consulted? Not Applicable   Payton Mccallum, MD 01/27/19 [phone]

## 2019-02-13 ENCOUNTER — Encounter: Payer: Self-pay | Admitting: Emergency Medicine

## 2019-02-13 ENCOUNTER — Ambulatory Visit
Admission: EM | Admit: 2019-02-13 | Discharge: 2019-02-13 | Disposition: A | Discharge status: Home / Self Care | Payer: Medicare Other | Attending: Family Medicine | Admitting: Family Medicine

## 2019-02-13 ENCOUNTER — Other Ambulatory Visit: Payer: Self-pay

## 2019-02-13 DIAGNOSIS — K625 Hemorrhage of anus and rectum: Secondary | ICD-10-CM

## 2019-02-13 LAB — CBC
HCT: 44.7 % (ref 39.0–52.0)
Hemoglobin: 15.5 g/dL (ref 13.0–17.0)
MCH: 33.5 pg (ref 26.0–34.0)
MCHC: 34.7 g/dL (ref 30.0–36.0)
MCV: 96.5 fL (ref 80.0–100.0)
Platelets: 203 10*3/uL (ref 150–400)
RBC: 4.63 MIL/uL (ref 4.22–5.81)
RDW: 13.6 % (ref 11.5–15.5)
WBC: 7.5 10*3/uL (ref 4.0–10.5)
nRBC: 0 % (ref 0.0–0.2)

## 2019-02-13 LAB — BASIC METABOLIC PANEL
Anion gap: 11 (ref 5–15)
BUN: 11 mg/dL (ref 8–23)
CO2: 22 mmol/L (ref 22–32)
Calcium: 8.9 mg/dL (ref 8.9–10.3)
Chloride: 102 mmol/L (ref 98–111)
Creatinine, Ser: 0.96 mg/dL (ref 0.61–1.24)
GFR calc Af Amer: >60 mL/min (ref >60)
GFR calc non Af Amer: >60 mL/min (ref >60)
Glucose, Bld: 88 mg/dL (ref 70–99)
Potassium: 4 mmol/L (ref 3.5–5.1)
Sodium: 135 mmol/L (ref 135–145)

## 2019-02-13 LAB — OCCULT BLOOD X 1 CARD TO LAB, STOOL: Fecal Occult Bld: POSITIVE — AB

## 2019-02-13 NOTE — ED Provider Notes (Signed)
[address], [address] [place], [place] [address] [phone]   Name: Aaron Villanueva DOB: 09-Jan-1951 MRN: [id number] CSN: [id number] PCP: Lauro Regulus, MD  Arrival date and time:  02/13/19 1047  Chief Complaint:  Rectal Bleeding  NOTE: Prior to seeing the patient today, I have reviewed the triage nursing documentation and vital signs. Clinical staff has updated patient's PMH/PSHx, current medication list, and drug allergies/intolerances to ensure comprehensive history available to assist in medical decision making.   History:   HPI: Aaron Villanueva is a 68 y.o. male who presents today with complaints of passing BRBPR this morning following a bowel movement. Patient appreciated bright red blood on his toilet tissue, in addition to clots in the toilet, following a normal bowel movement earlier this morning. Patient denies abdominal pain or pain in his rectum. He has not experienced any associated nausea, vomiting, or recent changes to his bowel habits. He states, "I do not have to strain to move my bowels". Patient denies having known internal or external hemorrhoids. He has not experienced any vertiginous symptoms, chest pain, shortness of breath, or palpitations. Patient denies weakness and fatigue.  Patient is currently on chronic anticoagulation therapy. He has been on apixaban x 2 years following a pulmonary embolism. Patient denies any known underlying hematological disorders.   Past Medical History:  Diagnosis Date   COPD (chronic obstructive pulmonary disease) (HCC)    PE (pulmonary thromboembolism) (HCC)    Tobacco abuse     Past Surgical History:  Procedure Laterality Date   NO PAST SURGERIES      Family History  Problem Relation Age of Onset   Lung cancer Mother     Social History   Socioeconomic History   Marital status: Married    Spouse name: Not on file   Number of children: Not on file   Years of education: Not on file   Highest  education level: Not on file  Occupational History   Not on file  Social Needs   Financial resource strain: Not on file   Food insecurity:    Worry: Not on file    Inability: Not on file   Transportation needs:    Medical: Not on file    Non-medical: Not on file  Tobacco Use   Smoking status: Former Smoker    Packs/day: 1.00    Years: 50.00    Pack years: 50.00    Types: Cigarettes    Last attempt to quit: 10/28/2016    Years since quitting: 2.2   Smokeless tobacco: Never Used  Substance and Sexual Activity   Alcohol use: No   Drug use: No   Sexual activity: Not on file  Lifestyle   Physical activity:    Days per week: Not on file    Minutes per session: Not on file   Stress: Not on file  Relationships   Social connections:    Talks on phone: Not on file    Gets together: Not on file    Attends religious service: Not on file    Active member of club or organization: Not on file    Attends meetings of clubs or organizations: Not on file    Relationship status: Not on file   Intimate partner violence:    Fear of current or ex partner: Not on file    Emotionally abused: Not on file    Physically abused: Not on file    Forced sexual activity: Not on file  Other Topics  Concern   Not on file  Social History Narrative   Not on file    Patient Active Problem List   Diagnosis Date Noted   Pulmonary embolus (HCC) 10/29/2016    Home Medications:    Current Meds  Medication Sig   apixaban (ELIQUIS) 5 MG TABS tablet Take 2 tablets (10 mg total) by mouth 2 (two) times daily.    Allergies:   Percocet [oxycodone-acetaminophen]  Review of Systems (ROS): Review of Systems  Constitutional: Negative for chills, fatigue and fever.  Respiratory: Negative for cough and shortness of breath.   Cardiovascular: Negative for chest pain and palpitations.  Gastrointestinal: Positive for blood in stool (acute BRBPR today). Negative for abdominal distention,  abdominal pain, constipation, diarrhea, nausea, rectal pain and vomiting.  Musculoskeletal: Negative for back pain, gait problem and myalgias.  Skin: Negative for color change and pallor.  Neurological: Positive for dizziness. Negative for weakness, light-headedness and headaches.  Hematological:       On chronic anticoagulation (apixaban) 2/2 pulmonary embolism     Physical Exam:  Triage Vital Signs ED Triage Vitals  Enc Vitals Group     BP 02/13/19 1058 (!) 130/92     Pulse Rate 02/13/19 1058 62     Resp 02/13/19 1058 16     Temp 02/13/19 1058 98.1 F (36.7 C)     Temp Source 02/13/19 1058 Oral     SpO2 02/13/19 1058 97 %     Weight 02/13/19 1056 220 lb (99.8 kg)     Height 02/13/19 1056 5\' 10"  (1.778 m)     Head Circumference --      Peak Flow --      Pain Score 02/13/19 1056 0     Pain Loc --      Pain Edu? --      Excl. in GC? --     Physical Exam  Constitutional: He is oriented to person, place, and time and well-developed, well-nourished, and in no distress.  HENT:  Head: Normocephalic and atraumatic.  Mouth/Throat: Oropharynx is clear and moist and mucous membranes are normal.  Eyes: Pupils are equal, round, and reactive to light. Conjunctivae and EOM are normal.  Neck: Normal range of motion. Neck supple. No tracheal deviation present.  Cardiovascular: Normal rate, regular rhythm, normal heart sounds and intact distal pulses. Exam reveals no gallop and no friction rub.  No murmur heard. Pulmonary/Chest: Effort normal and breath sounds normal. No respiratory distress. He has no wheezes. He has no rales.  Abdominal: Soft. Normal appearance, normal aorta and bowel sounds are normal. He exhibits no distension, no abdominal bruit, no ascites and no pulsatile midline mass. There is no hepatosplenomegaly. There is no abdominal tenderness. There is no CVA tenderness.  Genitourinary: Rectum:     Guaiac result positive.     No anal fissure, tenderness, external hemorrhoid or  internal hemorrhoid.   Lymphadenopathy:    He has no cervical adenopathy.  Neurological: He is alert and oriented to person, place, and time.  Skin: Skin is warm and dry. No rash noted. No erythema.  Psychiatric: Mood, affect and judgment normal.  Nursing note and vitals reviewed.    Urgent Care Treatments / Results:   LABS: PLEASE NOTE: all labs that were ordered this encounter are listed, however only abnormal results are displayed. Labs Reviewed  OCCULT BLOOD X 1 CARD TO LAB, STOOL - Abnormal; Notable for the following components:      Result Value   Fecal Occult  Bld POSITIVE (*)    All other components within normal limits  BASIC METABOLIC PANEL  CBC  PROTIME-INR    EKG: -None  RADIOLOGY: No results found.  PRODEDURES: Procedures  MEDICATIONS RECEIVED THIS VISIT: Medications - No data to display  PERTINENT CLINICAL COURSE NOTES/UPDATES: Clinical Course as of Feb 13 1199  Tue Feb 13, 2019  1127 Rectal exam performed. No external hemorrhoids noted. Sample collected for guaiac testing.    [BG]  1137 Guaiac stool card x 1 (+) for occult blood. [facility] and appointment secured for 02/14/2019 at 10:30 am.    [BG]    Clinical Course User Index [BG] Verlee MonteGray, Aurilla Coulibaly E, NP   Initial Impression / Assessment and Plan / Urgent Care Course:    Aaron Villanueva is a 68 y.o. male who [facility] today with complaints of Rectal Bleeding  Pertinent labs & imaging results that were available during my care of the patient were personally reviewed by me and considered in my medical decision making (see lab/imaging section of note for values and interpretations).  Patient is well appearing overall. He is hemodynamically stable and not overtly symptomatic. He denies vertiginous symptoms, chest pain, shortness of breath, and palpitations. His color is normal. Rectal exam revealed no external hemorrhoids or visible anal fissures. Guaiac card x 1 (+) for  occult blood. CBC is normal. PT/INR pending. Discussed internal hemorrhoids as part of the differential, however advised patient that diagnosis would require direct visualization via colonoscopy. Patient stable overall at the time he was seen in clinic today.   Given exam and lab workup, patient felt to be safe for outpatient follow up. Patient sees Dr. Dareen PianoAnderson for primary [facility]. In efforts to keep care in same practice, I called [facility] and secured patient an appointment for 02/14/19 at 10:30 am for further evaluation. I have reviewed the follow up and strict return precautions for any new or worsening symptoms. Patient is aware of symptoms that would be deemed urgent/emergent, and would thus require further evaluation either here or in the emergency department. Discussed with patient that any significant bleeding/chest pain/SOB would necessitate him being seem TODAY/TONIGHT in the ED for more urgent evaluation. At the time of discharge, he verbalized understanding and consent with the discharge plan as it was reviewed with him. All questions were fielded by provider and/or clinic staff prior to patient discharge.    Final Clinical Impressions(s) / Urgent Care Diagnoses:   Final diagnoses:  Rectal bleeding    New Prescriptions:  No orders of the defined types were placed in this encounter.   Controlled Substance Prescriptions:  [place] [address] Controlled Substance Registry consulted? Not Applicable  NOTE: This note was prepared using Dragon dictation software along with smaller phrase technology. Despite my best ability to proofread, there is the potential that transcriptional errors may still occur from this process, and are completely unintentional.     Verlee MonteGray, Trishelle Devora E, NP 02/13/19 1220

## 2019-02-13 NOTE — ED Triage Notes (Signed)
Patient states that after a bowel movement he noticed blood on the toilet paper and in the toilet this morning. Patient denies any pain.

## 2019-02-13 NOTE — Discharge Instructions (Addendum)
It was very nice meeting you today in clinic. Thank you for entrusting me with your care.   As discussed, your labs were normal. You are not having pain. The blood you saw could be related to any internal hemorrhoid, or something more significant. You will need to follow up with gastroenterology for ongoing management.   If your symptoms/condition worsens (pain, more bleeding, chest pain, shortness of breath), please seek follow up care in the ER. Please remember, our [facility] providers are "right here with you" when you need [place].   Again, it was my pleasure to take care of you today. Thank you for choosing our clinic. I hope that you start to feel better quickly.   Quentin Mulling, MSN, APRN, FNP-C, CEN Advanced Practice Provider [facility]

## 2019-04-06 ENCOUNTER — Ambulatory Visit (INDEPENDENT_AMBULATORY_CARE_PROVIDER_SITE_OTHER): Payer: Medicare Other

## 2019-04-06 ENCOUNTER — Encounter: Payer: Self-pay | Admitting: Emergency Medicine

## 2019-04-06 ENCOUNTER — Other Ambulatory Visit: Payer: Self-pay

## 2019-04-06 ENCOUNTER — Ambulatory Visit
Admission: EM | Admit: 2019-04-06 | Discharge: 2019-04-06 | Disposition: A | Discharge status: Home / Self Care | Payer: Medicare Other | Attending: Emergency Medicine | Admitting: Emergency Medicine

## 2019-04-06 DIAGNOSIS — M25571 Pain in right ankle and joints of right foot: Secondary | ICD-10-CM | POA: Diagnosis not present

## 2019-04-06 DIAGNOSIS — M79671 Pain in right foot: Secondary | ICD-10-CM | POA: Diagnosis not present

## 2019-04-06 LAB — URIC ACID: Uric Acid, Serum: 8.4 mg/dL (ref 3.7–8.6)

## 2019-04-06 MED ORDER — PREDNISONE 10 MG (21) PO TBPK: ORAL_TABLET | ORAL | 1 refills | Status: DC

## 2019-04-06 NOTE — ED Provider Notes (Signed)
[facility]    CSN: [id number] Arrival date & time: 04/06/19  4235     History   Chief Complaint Chief Complaint  Patient presents with  . Foot Pain    HPI Aaron Villanueva is a 68 y.o. male presenting with right foot pain. This is a chronic conditions with an acute flare that started two days ago. Pt denies any injury or overuse. Pain started in medial ankle and has started to radiate down to toes. Pt attempted wrapping foot and ankle in ACE bandage, Epson salt soaks and topical treatments at home to alleviate pain. Pt describes pain as throbbing at 2/10. Pt is able to ambulate with the cane he uses at baseline. Pt doesn't believe this is a gout flare due to the location of the pain, therefore he has not taken his colchicine. Pt had a similar type of pain to left foot three months ago that was treated here at this facilt by another provider. Podiatry appointment is set for next week. .  Past Medical History:  Diagnosis Date  . COPD (chronic obstructive pulmonary disease) ([place])   . PE (pulmonary thromboembolism) ([place])   . Tobacco abuse     Patient Active Problem List   Diagnosis Date Noted  . Pulmonary embolus ([place]) 10/29/2016    Past Surgical History:  Procedure Laterality Date  . NO PAST SURGERIES         Home Medications    Prior to Admission medications   Medication Sig Start Date End Date Taking? Authorizing Provider  apixaban (ELIQUIS) 5 MG TABS tablet Take 2 tablets (10 mg total) by mouth 2 (two) times daily. 10/31/16  Yes Epifanio Lesches, MD  colchicine 0.6 MG tablet Take 2 tablets (1.2mg ) by mouth at first sign of gout flare followed by 1 tablet (0.6mg ) after 1 hour. (Max 1.8mg  within 1 hour) 10/25/18  Yes [provider]  sildenafil (VIAGRA) 100 MG tablet Take by mouth. 07/06/16  Yes [provider]  HYDROcodone-acetaminophen (NORCO/VICODIN) 5-325 MG tablet 1-2 tabs po bid prn 01/05/19   Norval Gable, MD  MITIGARE 0.6 MG CAPS   10/25/18   [provider]  predniSONE (STERAPRED UNI-PAK 21 TAB) 10 MG (21) TBPK tablet Take as instructed on package (60, 50, 40, 30, 20, 10) 04/06/19   Gertie Baron, NP    Family History Family History  Problem Relation Age of Onset  . Lung cancer Mother     Social History Social History   Tobacco Use  . Smoking status: Former Smoker    Packs/day: 1.00    Years: 50.00    Pack years: 50.00    Types: Cigarettes    Quit date: 10/28/2016    Years since quitting: 2.4  . Smokeless tobacco: Never Used  Substance Use Topics  . Alcohol use: No  . Drug use: No     Allergies   Percocet [oxycodone-acetaminophen]   Review of Systems Review of Systems  Musculoskeletal: Positive for arthralgias, gait problem, joint swelling and myalgias.  Skin: Negative for color change and pallor.  All other systems reviewed and are negative.    Physical Exam Triage Vital Signs ED Triage Vitals  Enc Vitals Group     BP 04/06/19 0908 101/65     Pulse Rate 04/06/19 0908 64     Resp 04/06/19 0908 18     Temp 04/06/19 0908 98.1 F (36.7 C)     Temp Source 04/06/19 0908 Oral     SpO2 04/06/19 0908  98 %     Weight 04/06/19 0909 210 lb (95.3 kg)     Height 04/06/19 0909 5\' 10"  (1.778 m)     Head Circumference --      Peak Flow --      Pain Score 04/06/19 0909 2     Pain Loc --      Pain Edu? --      Excl. in GC? --    No data found.  Updated Vital Signs BP 101/65 (BP Location: Right Arm)   Pulse 64   Temp 98.1 F (36.7 C) (Oral)   Resp 18   Ht 5\' 10"  (1.778 m)   Wt 210 lb (95.3 kg)   SpO2 98%   BMI 30.13 kg/m   Visual Acuity Right Eye Distance:   Left Eye Distance:   Bilateral Distance:    Right Eye Near:   Left Eye Near:    Bilateral Near:     Physical Exam Constitutional:      Appearance: Normal appearance. He is normal weight.  Cardiovascular:     Pulses:          Dorsalis pedis pulses are 2+ on the right side.       Posterior tibial pulses are 1+ on  the right side.  Musculoskeletal:     Right ankle: He exhibits swelling. He exhibits no ecchymosis. Tenderness.     Right foot: Normal range of motion. No deformity.     Comments: Tenderness to first MTP joint and distal head of tibia. Strength and sensation WNL.   Feet:     Right foot:     Skin integrity: Erythema and warmth present.     Comments: Erythema and warmth to first MTP joint Neurological:     Mental Status: He is alert.      UC Treatments / Results  Labs (all labs ordered are listed, but only abnormal results are displayed) Labs Reviewed  URIC ACID    EKG   Radiology Dg Ankle 2 Views Right  Result Date: 04/06/2019 CLINICAL DATA:  Foot and ankle pain. EXAM: RIGHT ANKLE - 2 VIEW COMPARISON:  None. FINDINGS: There is no evidence of fracture, dislocation, or joint effusion. There soft tissues are unremarkable. IMPRESSION: No acute abnormality. Electronically Signed   By: Sherian ReinWei-Chen  Lin M.D.   On: 04/06/2019 10:12   Dg Foot 2 Views Right  Result Date: 04/06/2019 CLINICAL DATA:  Foot and ankle pain. EXAM: RIGHT FOOT - 2 VIEW COMPARISON:  None. FINDINGS: There is no evidence of fracture or dislocation. Soft tissues are unremarkable. IMPRESSION: No acute abnormality. Electronically Signed   By: Sherian ReinWei-Chen  Lin M.D.   On: 04/06/2019 10:11    Procedures Procedures (including critical care time)  Medications Ordered in UC Medications - No data to display  Initial Impression / Assessment and Plan / UC Course  I have reviewed the triage vital signs and the nursing notes.  Pertinent labs & imaging results that were available during my care of the patient were reviewed by me and considered in my medical decision making (see chart for details).     Pt presents with right foot pain, diagnosed with pain to the right foot and ankle (possible gout) and treated as such with the prescribed medications below. Xray ruled-out acute osseous injury and pt instructed to continue home  therapies if they assist in pain and swelling in conjunction with prescribed medications.  Pt to follow-up with podiatrist as planned in the upcoming week. All questions  answered and all concerns addressed.    Final Clinical Impressions(s) / UC Diagnoses   Final diagnoses:  Foot pain, right   Discharge Instructions   None    ED Prescriptions    Medication Sig Dispense Auth. Provider   predniSONE (STERAPRED UNI-PAK 21 TAB) 10 MG (21) TBPK tablet Take as instructed on package (60, 50, 40, 30, 20, 10) 1 each Bailey MechBenjamin, Xzandria Clevinger, NP        Bailey MechBenjamin, Josealberto Montalto, NP 04/06/19 1102

## 2019-04-06 NOTE — ED Triage Notes (Signed)
Patient c/o right foot pain and swelling that started 2 days ago. He denies injury.

## 2020-03-03 IMAGING — CR RIGHT FOOT - 2 VIEW
2 series · 2 of 2 positions shown · non-contrast
Comparison: None.

CLINICAL DATA: Foot and ankle pain.

EXAM:
RIGHT FOOT - 2 VIEW

[foot ap]
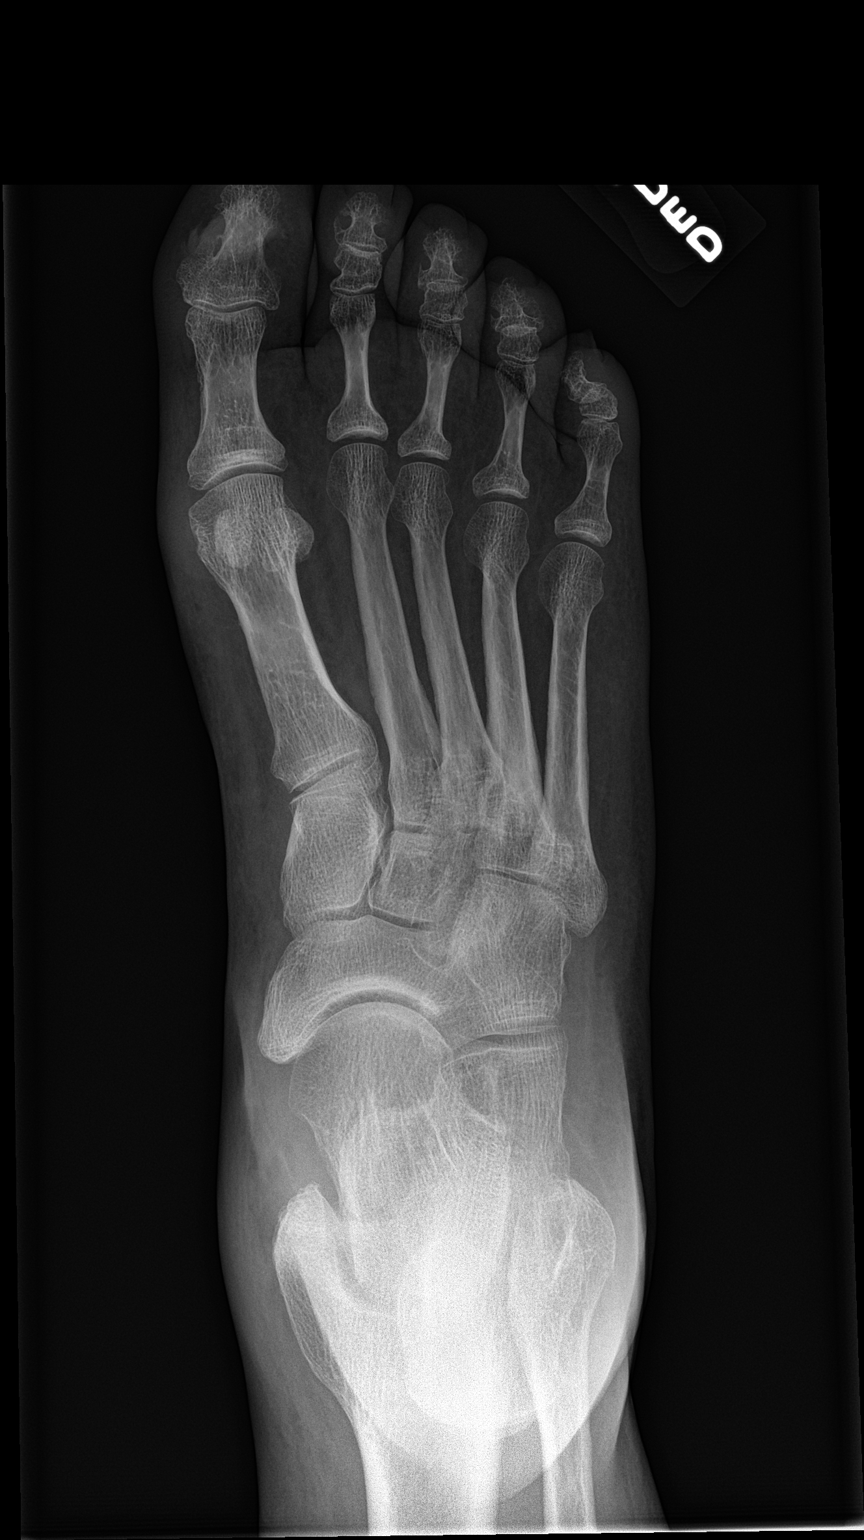

[foot lat]
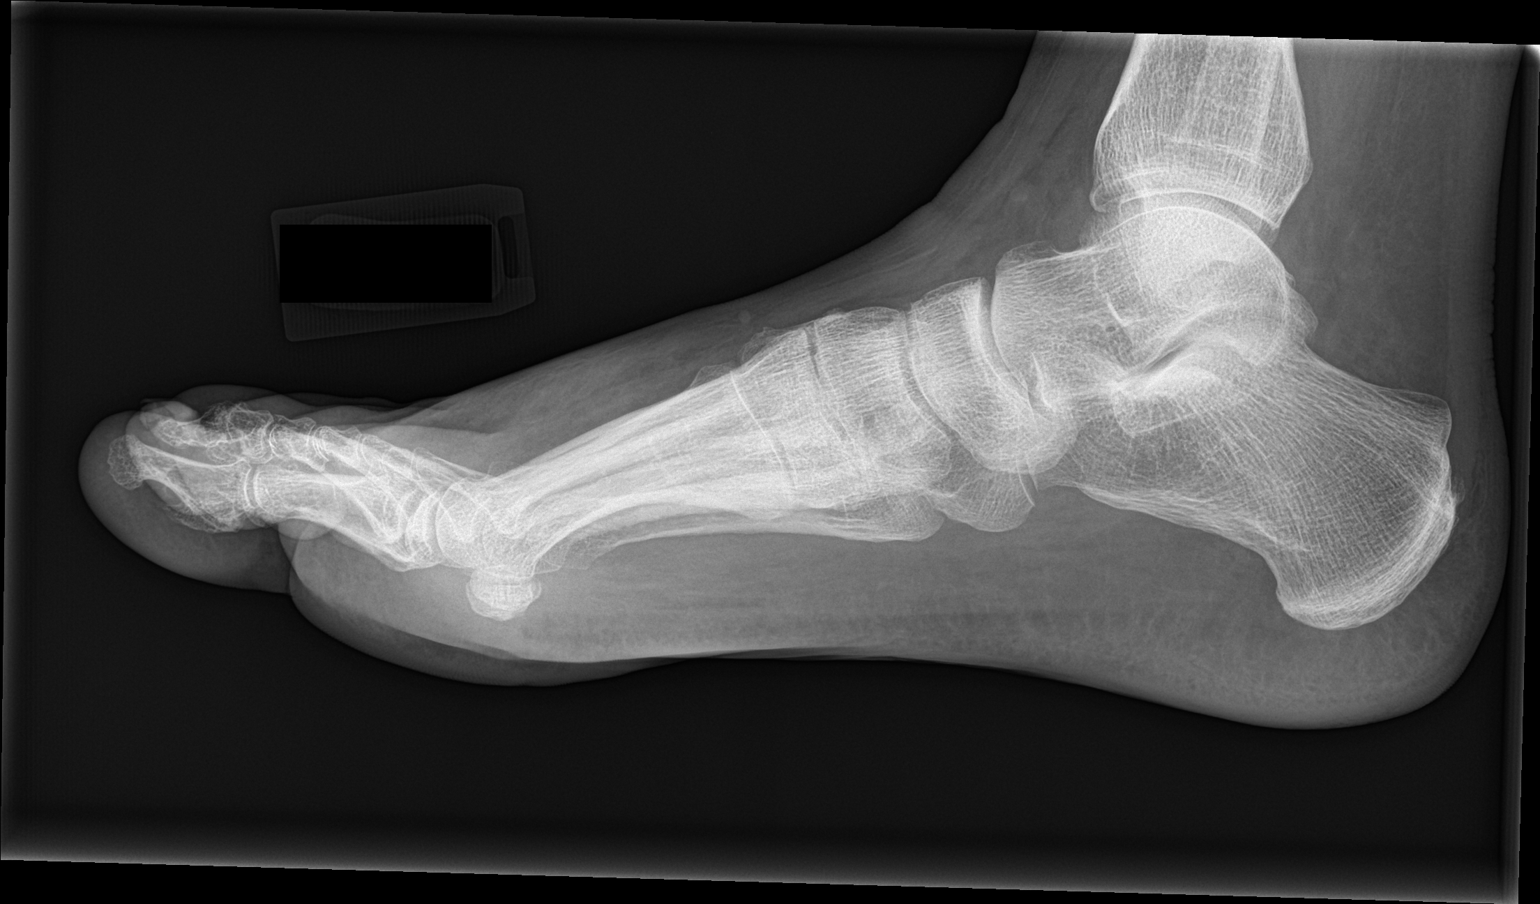

[2 of 2 positions shown; findings below may reference images not displayed]

FINDINGS: There is no evidence of fracture or dislocation. Soft tissues are
unremarkable.
IMPRESSION: No acute abnormality.

## 2020-03-03 IMAGING — CR RIGHT ANKLE - 2 VIEW
2 series · 2 of 2 positions shown · non-contrast
Comparison: None.

CLINICAL DATA: Foot and ankle pain.

EXAM:
RIGHT ANKLE - 2 VIEW

[ankle ap]
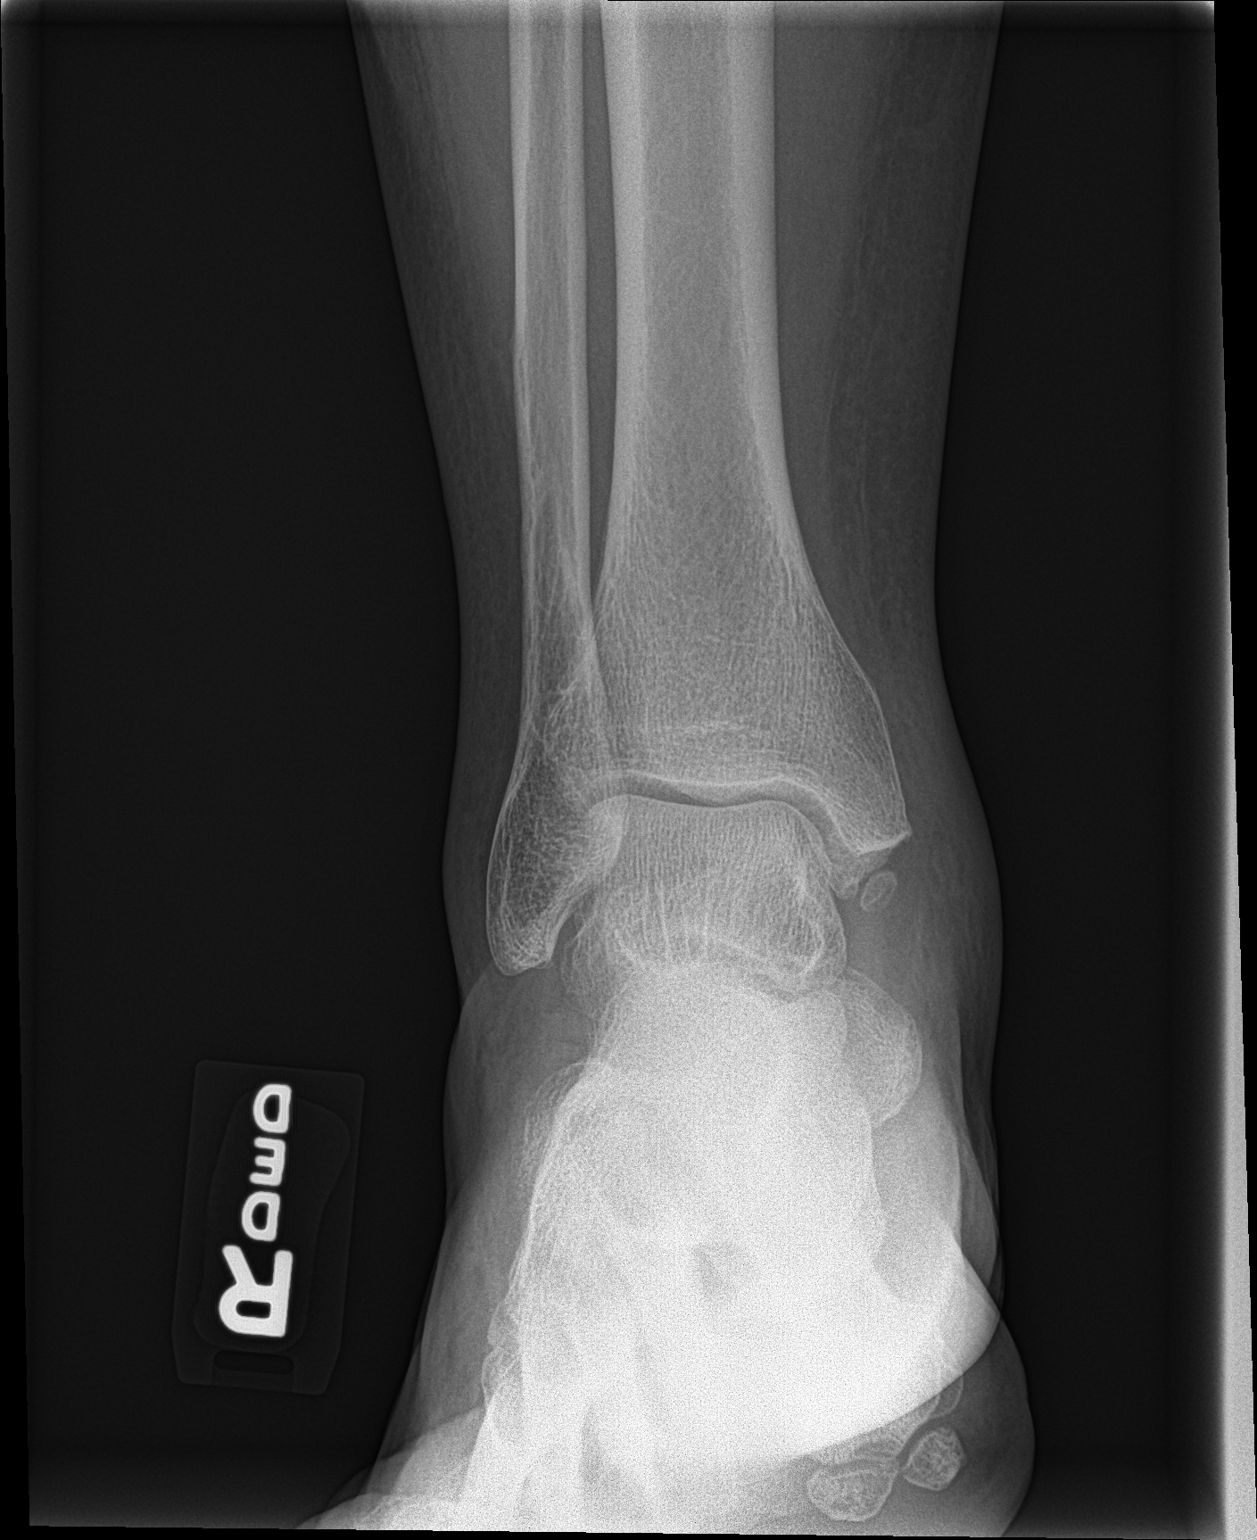

[ankle lat]
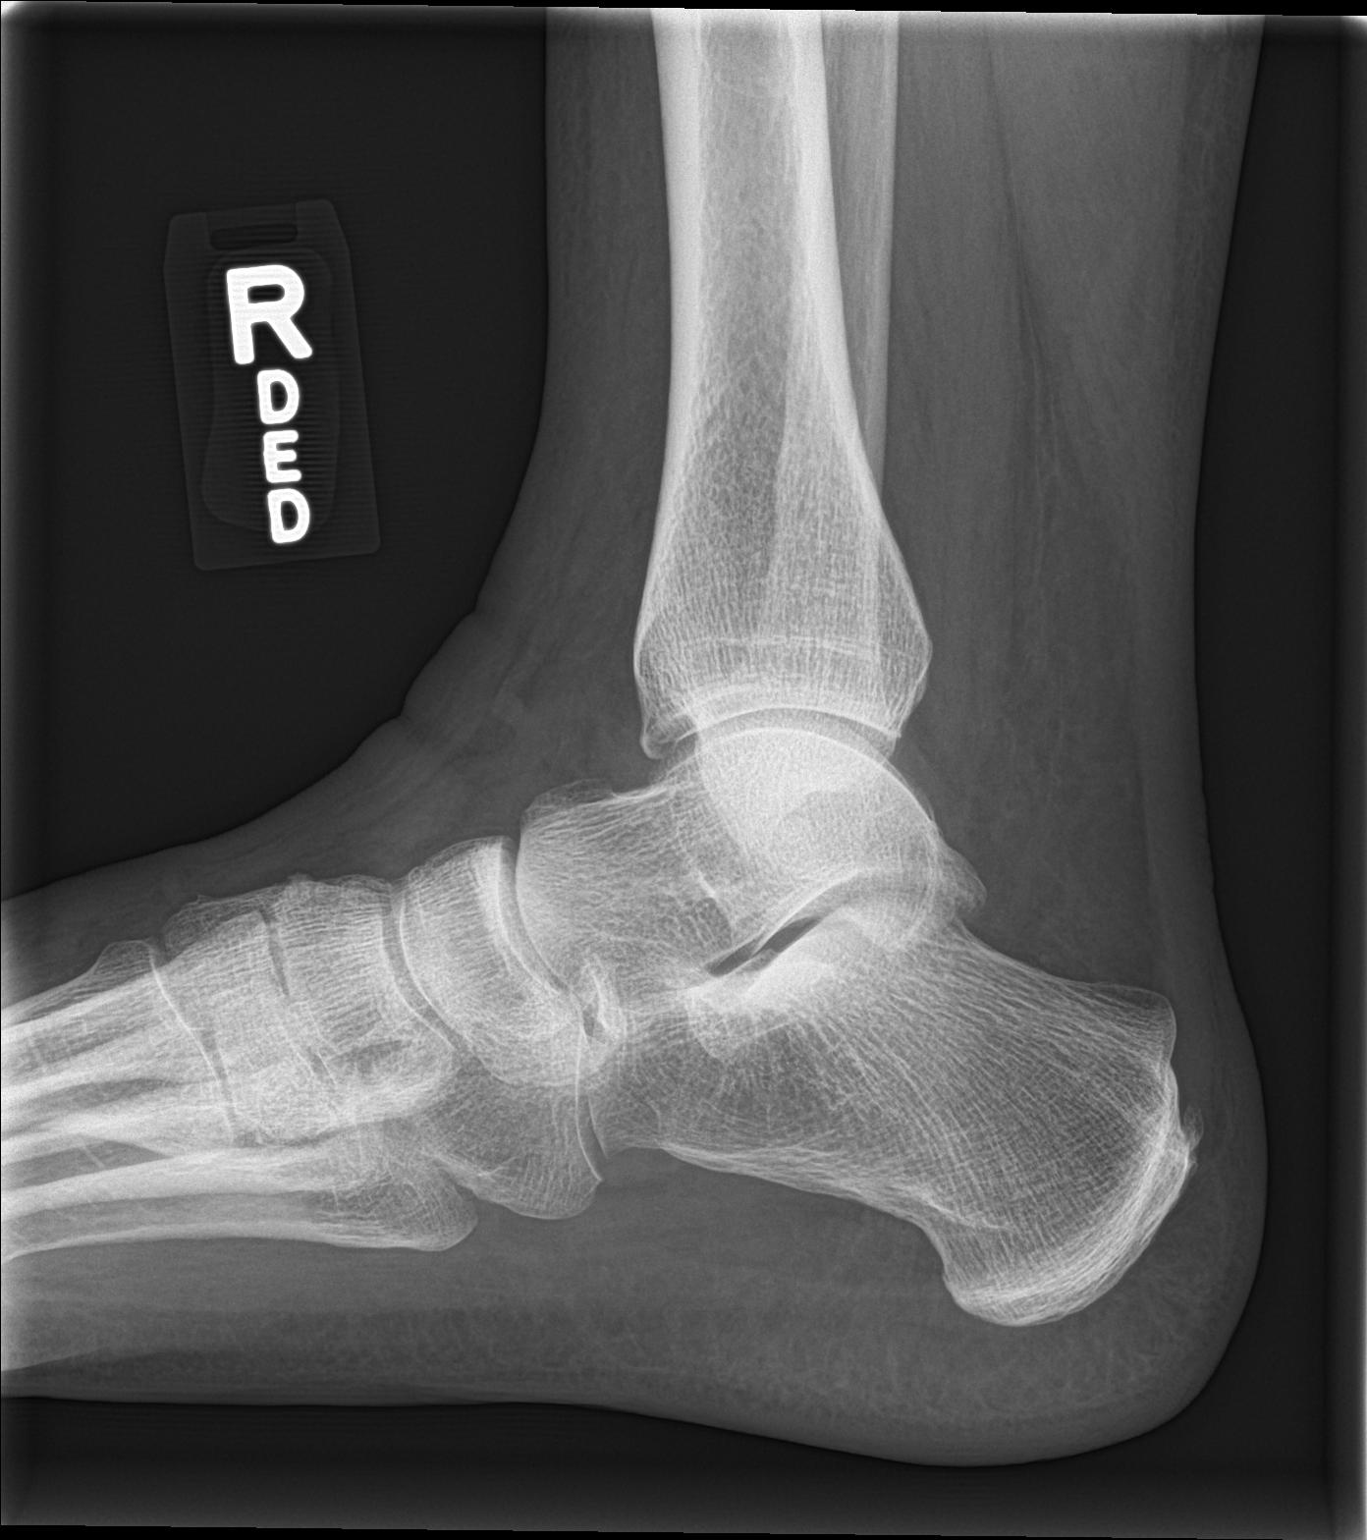

[2 of 2 positions shown; findings below may reference images not displayed]

FINDINGS: There is no evidence of fracture, dislocation, or joint effusion.
There soft tissues are unremarkable.
IMPRESSION: No acute abnormality.

## 2022-02-08 ENCOUNTER — Ambulatory Visit
Admission: RE | Admit: 2022-02-08 | Discharge: 2022-02-08 | Disposition: A | Discharge status: Home / Self Care | Payer: Commercial Managed Care - PPO | Source: Ambulatory Visit | Attending: Internal Medicine | Admitting: Internal Medicine

## 2022-02-08 VITALS — BP 132/84 | HR 58 | Temp 98.3°F | Resp 16 | Ht 70.0 in | Wt 210.1 lb

## 2022-02-08 DIAGNOSIS — S90561A Insect bite (nonvenomous), right ankle, initial encounter: Secondary | ICD-10-CM | POA: Diagnosis not present

## 2022-02-08 DIAGNOSIS — W57XXXA Bitten or stung by nonvenomous insect and other nonvenomous arthropods, initial encounter: Secondary | ICD-10-CM

## 2022-02-08 NOTE — ED Triage Notes (Signed)
Pt reports a bug bite on the right ankle on Thursday. Since then the bite has developed a rash around it. States the site is very itchy. Have been using antibiotic ointment and epsom salt soaks.  ?

## 2022-02-08 NOTE — ED Provider Notes (Signed)
?[place] ? ? ? ?CSN: [id number] ?Arrival date & time: 02/08/22  [id number] ? ? ?  ? ?History   ?Chief Complaint ?Chief Complaint  ?Patient presents with  ? Rash  ?  Bug ( tick?) bite with surrounding rash since Thurs. 11th. - Entered by patient  ? Insect Bite  ? ? ?HPI ?Aaron Villanueva is a 71 y.o. male.  ? ?HPI ? ?71-year-old male here for evaluation of skin complaint. ? ?Patient reports that he sustained a bug bite of some kind on the outside of his right ankle 4 days ago.  At the time he did have some itching.  The following day he noticed a red rash developed around the bite area and he has been treating it at home with Epsom salt soaks and antibiotic ointment.  He came in today for evaluation on the behest of his spouse.  He denies any fever, headache, joint pain, or muscle aches. ? ?Past Medical History:  ?Diagnosis Date  ? COPD (chronic obstructive pulmonary disease) ([place])   ? PE (pulmonary thromboembolism) ([place])   ? Tobacco abuse   ? ? ?Patient Active Problem List  ? Diagnosis Date Noted  ? Pulmonary embolus ([place]) 10/29/2016  ? ? ?Past Surgical History:  ?Procedure Laterality Date  ? NO PAST SURGERIES    ? ? ? ? ? ?Home Medications   ? ?Prior to Admission medications   ?Medication Sig Start Date End Date Taking? Authorizing Provider  ?apixaban (ELIQUIS) 5 MG TABS tablet Take 2 tablets (10 mg total) by mouth 2 (two) times daily. 10/31/16   Epifanio Lesches, MD  ?colchicine 0.6 MG tablet Take 2 tablets (1.2mg ) by mouth at first sign of gout flare followed by 1 tablet (0.6mg ) after 1 hour. (Max 1.8mg  within 1 hour) 10/25/18   [provider]  ?HYDROcodone-acetaminophen (NORCO/VICODIN) 5-325 MG tablet 1-2 tabs po bid prn 01/05/19   Norval Gable, MD  ?[facility] 0.6 MG CAPS  10/25/18   [provider]  ?predniSONE (STERAPRED UNI-PAK 21 TAB) 10 MG (21) TBPK tablet Take as instructed on package (60, 50, 40, 30, 20, 10) 04/06/19   Gertie Baron, NP  ?sildenafil (VIAGRA) 100 MG tablet Take  by mouth. 07/06/16   [provider]  ? ? ?Family History ?Family History  ?Problem Relation Age of Onset  ? Lung cancer Mother   ? ? ?Social History ?Social History  ? ?Tobacco Use  ? Smoking status: Former  ?  Packs/day: 1.00  ?  Years: 50.00  ?  Pack years: 50.00  ?  Types: Cigarettes  ?  Quit date: 10/28/2016  ?  Years since quitting: 5.2  ? Smokeless tobacco: Never  ?Vaping Use  ? Vaping Use: Never used  ?Substance Use Topics  ? Alcohol use: No  ? Drug use: No  ? ? ? ?Allergies   ?Percocet [oxycodone-acetaminophen] ? ? ?Review of Systems ?Review of Systems  ?Constitutional:  Negative for fever.  ?Musculoskeletal:  Negative for arthralgias and myalgias.  ?Skin:  Positive for color change, rash and wound.  ?Neurological:  Negative for headaches.  ?Hematological: Negative.   ?Psychiatric/Behavioral: Negative.    ? ? ?Physical Exam ?Triage Vital Signs ?ED Triage Vitals  ?Enc Vitals Group  ?   BP 02/08/22 1016 132/84  ?   Pulse Rate 02/08/22 1016 (!) 58  ?   Resp 02/08/22 1016 16  ?   Temp 02/08/22 1016 98.3 ?F (36.8 ?C)  ?   Temp Source 02/08/22 1016 Oral  ?  SpO2 02/08/22 1016 96 %  ?   Weight 02/08/22 1015 210 lb 1.6 oz (95.3 kg)  ?   Height 02/08/22 1015 5\' 10"  (1.778 m)  ?   Head Circumference --   ?   Peak Flow --   ?   Pain Score 02/08/22 1015 0  ?   Pain Loc --   ?   Pain Edu? --   ?   Excl. in [place]? --   ? ?No data found. ? ?Updated Vital Signs ?BP 132/84 (BP Location: Right Arm)   Pulse (!) 58   Temp 98.3 ?F (36.8 ?C) (Oral)   Resp 16   Ht 5\' 10"  (1.778 m)   Wt 210 lb 1.6 oz (95.3 kg)   SpO2 96%   BMI 30.15 kg/m?  ? ?Visual Acuity ?Right Eye Distance:   ?Left Eye Distance:   ?Bilateral Distance:   ? ?Right Eye Near:   ?Left Eye Near:    ?Bilateral Near:    ? ?Physical Exam ?Vitals and nursing note reviewed.  ?Constitutional:   ?   Appearance: Normal appearance. He is not ill-appearing.  ?HENT:  ?   Head: Normocephalic and atraumatic.  ?Skin: ?   General: Skin is warm.  ?   Capillary Refill:  Capillary refill takes less than 2 seconds.  ?   Findings: Erythema and rash present.  ?Neurological:  ?   General: No focal deficit present.  ?   Mental Status: He is alert and oriented to person, place, and time.  ?Psychiatric:     ?   Mood and Affect: Mood normal.     ?   Behavior: Behavior normal.     ?   Thought Content: Thought content normal.     ?   Judgment: Judgment normal.  ? ? ? ?UC Treatments / Results  ?Labs ?(all labs ordered are listed, but only abnormal results are displayed) ?Labs Reviewed - No data to display ? ?EKG ? ? ?Radiology ?No results found. ? ?Procedures ?Procedures (including critical care time) ? ?Medications Ordered in UC ?Medications - No data to display ? ?Initial Impression / Assessment and Plan / UC Course  ?I have reviewed the triage vital signs and the nursing notes. ? ?Pertinent labs & imaging results that were available during my care of the patient were reviewed by me and considered in my medical decision making (see chart for details). ? ?Patient is a very pleasant, nontoxic-appearing 71-year-old male here for evaluation of a bug bite with surrounding erythema on the outside of his right ankle that has been present for last 4 days.  He states that he did not see the tick and is unsure of what bit him.  Initially it itched but that has resolved.  There is a scabbed bite over the proximal aspect of the lateral malleolus of the right ankle with an erythematous, macular rash surrounding the bite.  The entire area is approximately size of a half dollar.  There is no edema or warmth coming from the bite area.  No induration or fluctuance.  There is no indications of infection.  Suspected to histamine response from some sort of insect envenomation.  I do not suspect that it is a tickborne illness given the lack of fever, headache, joint pain, muscle pain.  Also no lymphadenopathy.  I advised patient has not improved with a DuoNeb or then to observe the area.  If he develops any  increased redness, swelling, heat at the site, or  fever he should return for reevaluation. ? ? ?Final Clinical Impressions(s) / UC Diagnoses  ? ?Final diagnoses:  ?Insect bite of right ankle, initial encounter  ? ? ? ?Discharge Instructions   ? ?  ?There is nothing further to do for your insect bite.  You may continue to soak in warm water and Epsom salts if you please and apply the antibiotic ointment. ? ?If you do have any increased redness, swelling at site, heat at the site, red streak going up your leg, or if you develop fever please return for reevaluation. ? ? ? ? ?ED Prescriptions   ?None ?  ? ?PDMP not reviewed this encounter. ?  ?Margarette Canada, NP ?02/08/22 1041 ? ?

## 2022-02-08 NOTE — Discharge Instructions (Addendum)
There is nothing further to do for your insect bite.  You may continue to soak in warm water and Epsom salts if you please and apply the antibiotic ointment. ? ?If you do have any increased redness, swelling at site, heat at the site, red streak going up your leg, or if you develop fever please return for reevaluation. ?

## 2023-08-20 ENCOUNTER — Ambulatory Visit
Admission: EM | Admit: 2023-08-20 | Discharge: 2023-08-20 | Disposition: A | Discharge status: Home / Self Care | Payer: Commercial Managed Care - PPO

## 2023-08-20 DIAGNOSIS — H00024 Hordeolum internum left upper eyelid: Secondary | ICD-10-CM

## 2023-08-20 MED ORDER — ERYTHROMYCIN 5 MG/GM OP OINT: TOPICAL_OINTMENT | OPHTHALMIC | 0 refills | Status: AC

## 2023-08-20 NOTE — ED Triage Notes (Signed)
Pt c/o left eye swelling and pressure along his eye lid. Pt is worried about pink eye x3days

## 2023-08-20 NOTE — ED Provider Notes (Signed)
[facility]    CSN: [id number] Arrival date & time: 08/20/23  1042      History   Chief Complaint Chief Complaint  Patient presents with   Facial Swelling    HPI Aaron Villanueva is a 72 y.o. male presenting for swelling, redness and discomfort of left upper eyelid.  He also reports crusting and drainage from the eye.  No redness of the eye.  No vision problems.  Denies fever or facial swelling.  No treatments performed at home.  HPI  Past Medical History:  Diagnosis Date   COPD (chronic obstructive pulmonary disease) ([facility])    PE (pulmonary thromboembolism) ([facility])    Tobacco abuse     Patient Active Problem List   Diagnosis Date Noted   Pulmonary embolus ([facility]) 10/29/2016    Past Surgical History:  Procedure Laterality Date   NO PAST SURGERIES         Home Medications    Prior to Admission medications   Medication Sig Start Date End Date Taking? Authorizing Provider  apixaban (ELIQUIS) 5 MG TABS tablet Take 2 tablets (10 mg total) by mouth 2 (two) times daily. 10/31/16  Yes Katha Hamming, MD  erythromycin ophthalmic ointment Place a 1/2 inch ribbon of ointment into the lower eyelid q6h 08/20/23 08/27/23 Yes Eusebio Friendly B, PA-C  GLUCOSAMINE SULFATE PO Take 1 tablet by mouth 2 (two) times daily.   Yes [provider]  colchicine 0.6 MG tablet Take 2 tablets (1.2mg ) by mouth at first sign of gout flare followed by 1 tablet (0.6mg ) after 1 hour. (Max 1.8mg  within 1 hour) 10/25/18   [provider]  HYDROcodone-acetaminophen (NORCO/VICODIN) 5-325 MG tablet 1-2 tabs po bid prn 01/05/19   Payton Mccallum, MD  MITIGARE 0.6 MG CAPS  10/25/18   [provider]  predniSONE (STERAPRED UNI-PAK 21 TAB) 10 MG (21) TBPK tablet Take as instructed on package (60, 50, 40, 30, 20, 10) 04/06/19   Bailey Mech, NP  sildenafil (VIAGRA) 100 MG tablet Take by mouth. 07/06/16   [provider]    Family History Family History   Problem Relation Age of Onset   Lung cancer Mother     Social History Social History   Tobacco Use   Smoking status: Former    Current packs/day: 0.00    Average packs/day: 1 pack/day for 50.0 years (50.0 ttl pk-yrs)    Types: Cigarettes    Start date: 10/28/1966    Quit date: 10/28/2016    Years since quitting: 6.8   Smokeless tobacco: Never  Vaping Use   Vaping status: Never Used  Substance Use Topics   Alcohol use: No   Drug use: No     Allergies   Percocet [oxycodone-acetaminophen]   Review of Systems Review of Systems  Constitutional:  Negative for fatigue.  HENT:  Negative for congestion, facial swelling and rhinorrhea.   Eyes:  Positive for pain and discharge. Negative for photophobia, redness and visual disturbance.  Respiratory:  Negative for cough.   Skin:  Negative for rash and wound.  Neurological:  Negative for dizziness and headaches.     Physical Exam Triage Vital Signs ED Triage Vitals  Encounter Vitals Group     BP      Systolic BP Percentile      Diastolic BP Percentile      Pulse      Resp      Temp      Temp src  SpO2      Weight      Height      Head Circumference      Peak Flow      Pain Score      Pain Loc      Pain Education      Exclude from Growth Chart    No data found.  Updated Vital Signs BP 117/75 (BP Location: Left Arm)   Pulse 65   Temp 98.5 F (36.9 C) (Oral)   Ht 5\' 10"  (1.778 m)   Wt 210 lb (95.3 kg)   SpO2 96%   BMI 30.13 kg/m   Visual Acuity Right Eye Distance:   Left Eye Distance:   Bilateral Distance:       Physical Exam Vitals and nursing note reviewed.  Constitutional:      General: He is not in acute distress.    Appearance: Normal appearance. He is well-developed. He is not ill-appearing.  HENT:     Head: Normocephalic and atraumatic.     Nose: Nose normal.     Mouth/Throat:     Mouth: Mucous membranes are moist.  Eyes:     General: No scleral icterus.       Left eye: Hordeolum  (left inner upper, yellowish discharge noted) present.    Extraocular Movements: Extraocular movements intact.     Conjunctiva/sclera: Conjunctivae normal.     Right eye: Right conjunctiva is not injected.     Left eye: Left conjunctiva is not injected.     Pupils: Pupils are equal, round, and reactive to light.  Cardiovascular:     Rate and Rhythm: Normal rate.  Pulmonary:     Effort: Pulmonary effort is normal. No respiratory distress.  Musculoskeletal:     Cervical back: Neck supple.  Skin:    General: Skin is warm and dry.     Capillary Refill: Capillary refill takes less than 2 seconds.  Neurological:     General: No focal deficit present.     Mental Status: He is alert. Mental status is at baseline.     Motor: No weakness.     Gait: Gait normal.  Psychiatric:        Mood and Affect: Mood normal.        Behavior: Behavior normal.      UC Treatments / Results  Labs (all labs ordered are listed, but only abnormal results are displayed) Labs Reviewed - No data to display  EKG   Radiology No results found.  Procedures Procedures (including critical care time)  Medications Ordered in UC Medications - No data to display  Initial Impression / Assessment and Plan / UC Course  I have reviewed the triage vital signs and the nursing notes.  Pertinent labs & imaging results that were available during my care of the patient were reviewed by me and considered in my medical decision making (see chart for details).   72 y/o male presents for stye of left upper eyelid for the past few days.  Denies fever or facial swelling.  Will treat at this time with erythromycin ophthalmic ointment and warm compresses.  Reviewed return precautions.   Final Clinical Impressions(s) / UC Diagnoses   Final diagnoses:  Hordeolum internum left upper eyelid     Discharge Instructions      -You have a stye. - I sent an ointment to the pharmacy for you. - Warm compresses throughout the  day. - Return if symptoms are not improving over the  next week or if they worsen.     ED Prescriptions     Medication Sig Dispense Auth. Provider   erythromycin ophthalmic ointment Place a 1/2 inch ribbon of ointment into the lower eyelid q6h 3.5 g Shirlee Latch, PA-C      PDMP not reviewed this encounter.   Shirlee Latch, PA-C 08/20/23 1122

## 2023-08-20 NOTE — Discharge Instructions (Addendum)
-  You have a stye. - I sent an ointment to the pharmacy for you. - Warm compresses throughout the day. - Return if symptoms are not improving over the next week or if they worsen.

## 2023-11-28 ENCOUNTER — Encounter: Payer: Self-pay | Admitting: Emergency Medicine

## 2023-11-28 ENCOUNTER — Ambulatory Visit
Admission: EM | Admit: 2023-11-28 | Discharge: 2023-11-28 | Disposition: A | Discharge status: Home / Self Care | Attending: Emergency Medicine | Admitting: Emergency Medicine

## 2023-11-28 ENCOUNTER — Ambulatory Visit (INDEPENDENT_AMBULATORY_CARE_PROVIDER_SITE_OTHER)

## 2023-11-28 DIAGNOSIS — R109 Unspecified abdominal pain: Secondary | ICD-10-CM

## 2023-11-28 DIAGNOSIS — K5900 Constipation, unspecified: Secondary | ICD-10-CM

## 2023-11-28 NOTE — Discharge Instructions (Addendum)
 Increase your oral water intake so that you are remaining well-hydrated and there is increased water to help soften your bowel.  Purchase an 8 ounce bottle of MiraLAX along with 64 ounces of beverage of your choice.  Avoid anything red or purple.  Mix half of the bottle of MiraLAX in the 64 ounces of fluid and drink it slowly over 2 hours.  If you have not had a bowel movement by tomorrow morning makes the other half of the bottle of MiraLAX and another 64 ounces of fluid and consume it the same way.  You can also take 2 over-the-counter Dulcolax tablets along with your first dose of MiraLAX to help move your stools along.  Increase your oral dietary fiber intake so as to add bulk to your stool and help decrease incidence of constipation.  If you have any increasing abdominal pain, blood in your stool, abdominal bloating, or fever please follow-up in the emergency department for evaluation.

## 2023-11-28 NOTE — ED Triage Notes (Signed)
 Patient states he was lifting a pool table 4 days ago and heard a "pop". Patient is c/o constipation, last BM was 3 days ago and also c/o left sided abdominal discomfort. Denis nausea.

## 2023-11-28 NOTE — ED Provider Notes (Signed)
 [facility]    CSN: [id number] Arrival date & time: 11/28/23  1031      History   Chief Complaint Chief Complaint  Patient presents with   Abdominal Pain    HPI Aaron Villanueva is a 73 y.o. male.   HPI  73 year old male with past medical history significant for tobacco abuse, COPD, and PE presents for evaluation of no bowel movement for the last 3 days.  He reports that 4 days ago he was lifting a pool table and heard a pop and had pain across his lower abdomen and into his back.  He is not having any pain in his abdomen or back at present.  He does occasionally have some pain on the left side of his abdomen that comes and goes and is not present currently.  He denies any abdominal distention.  He reports that he typically has a bowel movement every day but has not had a bowel movement in the last 3 days with the exception of the day after he had a small, hard stool.  He is concerned about possible constipation.  No history of small bowel obstructions or abdominal surgeries.  Past Medical History:  Diagnosis Date   COPD (chronic obstructive pulmonary disease) ([facility])    PE (pulmonary thromboembolism) ([facility])    Tobacco abuse     Patient Active Problem List   Diagnosis Date Noted   Pulmonary embolus ([facility]) 10/29/2016    Past Surgical History:  Procedure Laterality Date   NO PAST SURGERIES         Home Medications    Prior to Admission medications   Medication Sig Start Date End Date Taking? Authorizing Provider  apixaban (ELIQUIS) 5 MG TABS tablet Take 2 tablets (10 mg total) by mouth 2 (two) times daily. 10/31/16   Katha Hamming, MD  GLUCOSAMINE SULFATE PO Take 1 tablet by mouth 2 (two) times daily.    [provider]  sildenafil (VIAGRA) 100 MG tablet Take by mouth. 07/06/16   [provider]    Family History Family History  Problem Relation Age of Onset   Lung cancer Mother     Social History Social History   Tobacco Use    Smoking status: Former    Current packs/day: 0.00    Average packs/day: 1 pack/day for 50.0 years (50.0 ttl pk-yrs)    Types: Cigarettes    Start date: 10/28/1966    Quit date: 10/28/2016    Years since quitting: 7.0   Smokeless tobacco: Never  Vaping Use   Vaping status: Never Used  Substance Use Topics   Alcohol use: No   Drug use: No     Allergies   Percocet [oxycodone-acetaminophen]   Review of Systems Review of Systems  Constitutional:  Negative for fever.  Gastrointestinal:  Positive for abdominal pain and constipation. Negative for abdominal distention, blood in stool, nausea and vomiting.  Genitourinary:  Negative for hematuria.     Physical Exam Triage Vital Signs ED Triage Vitals  Encounter Vitals Group     BP 11/28/23 1059 121/81     Systolic BP Percentile --      Diastolic BP Percentile --      Pulse Rate 11/28/23 1059 64     Resp 11/28/23 1059 18     Temp 11/28/23 1059 98.6 F (37 C)     Temp Source 11/28/23 1059 Oral     SpO2 11/28/23 1059 95 %     Weight --  Height --      Head Circumference --      Peak Flow --      Pain Score 11/28/23 1101 0     Pain Loc --      Pain Education --      Exclude from Growth Chart --    No data found.  Updated Vital Signs BP 121/81 (BP Location: Left Arm)   Pulse 64   Temp 98.6 F (37 C) (Oral)   Resp 18   SpO2 95%   Visual Acuity Right Eye Distance:   Left Eye Distance:   Bilateral Distance:    Right Eye Near:   Left Eye Near:    Bilateral Near:     Physical Exam Vitals and nursing note reviewed.  Constitutional:      Appearance: Normal appearance. He is not ill-appearing.  HENT:     Head: Normocephalic and atraumatic.  Cardiovascular:     Rate and Rhythm: Normal rate and regular rhythm.     Pulses: Normal pulses.     Heart sounds: Normal heart sounds. No murmur heard.    No friction rub. No gallop.  Pulmonary:     Effort: Pulmonary effort is normal.     Breath sounds: Normal breath  sounds. No wheezing, rhonchi or rales.  Abdominal:     General: Abdomen is flat. There is no distension.     Palpations: Abdomen is soft. There is no mass.     Tenderness: There is no abdominal tenderness. There is no guarding or rebound.     Hernia: No hernia is present.  Skin:    General: Skin is warm and dry.     Capillary Refill: Capillary refill takes less than 2 seconds.     Findings: No bruising or erythema.  Neurological:     General: No focal deficit present.     Mental Status: He is alert and oriented to person, place, and time.      UC Treatments / Results  Labs (all labs ordered are listed, but only abnormal results are displayed) Labs Reviewed - No data to display  EKG   Radiology DG Abd 2 Views Result Date: 11/28/2023 CLINICAL DATA:  Left-sided abdominal pain. Pain after heavy lifting. EXAM: ABDOMEN - 2 VIEW COMPARISON:  None Available. FINDINGS: Large amount of diffuse colonic stool identified. There is gas seen in nondilated loops of small large bowel diffusely. Several loops are distended. No frank obstruction. No obvious free air on these upright and supine radiographs. Although the diaphragm is clipped off the edge of the film on the upright view limiting evaluation. Osteopenia. Curvature of the spine with degenerative changes. Scattered vascular calcifications. Further workup as clinically appropriate. IMPRESSION: Distended loops of bowel diffusely with a large amount of colonic stool. Osteopenia with degenerative changes. No obvious free air but the diaphragm is clipped off the edge of the film on the right side on the upright views. Electronically Signed   By: Karen Kays M.D.   On: 11/28/2023 12:47    Procedures Procedures (including critical care time)  Medications Ordered in UC Medications - No data to display  Initial Impression / Assessment and Plan / UC Course  I have reviewed the triage vital signs and the nursing notes.  Pertinent labs & imaging  results that were available during my care of the patient were reviewed by me and considered in my medical decision making (see chart for details).   Patient is a pleasant, nontoxic-appearing 73 year old male  presenting for evaluation of possible constipation as outlined in HPI above.  On exam patient's abdomen is soft, flat, and nontender to palpation.  No appreciable masses or hernias noted.  No defect to the abdominal wall or abdominal distention noted.  No ecchymosis or erythema.  The patient reports that he typically has a bowel movement every day but has not had a bowel movement in the last 3 days save for a small, hard stool the day after he lifted the pool table and heard a pop in his lower abdomen.  I will obtain the two-view abdomen to evaluate the presence of constipation or developing obstruction.  The patient also denies any urinary symptoms and states he has not noticed any blood in his urine as he has been checking.  2 view abdomen independent reviewed and evaluated by me.  Impression: Patient has extensive amount of gas within his bowel as well as stool in his distal colon and rectum.  Patient also has stool collecting in his ascending and proximal transverse colon.  There is also concern for possible developing obstruction giving an acute bend and stricture at the splenic flexure.  Radiology overread is pending. Radiology impression states distended loops of bowel diffusely with large amounts of colonic stool.  Osteopenia with degenerative changes.  No obvious free air but diaphragm is clipped off of the edge of the film of the right side on the upright views.  No frank obstruction.  I will discharge patient on the diagnosis of gaseous distention and constipation and have him follow-up cleanout protocol using MiraLAX and Dulcolax.  If he develops any significant, constant abdominal pain, fevers, nausea and vomiting where he cannot keep down medication, or continues to not have a bowel  movement over the next 1 to 2 days he should seek care in the ER.   Final Clinical Impressions(s) / UC Diagnoses   Final diagnoses:  Left sided abdominal pain  Constipation, unspecified constipation type     Discharge Instructions      Increase your oral water intake so that you are remaining well-hydrated and there is increased water to help soften your bowel.  Purchase an 8 ounce bottle of MiraLAX along with 64 ounces of beverage of your choice.  Avoid anything red or purple.  Mix half of the bottle of MiraLAX in the 64 ounces of fluid and drink it slowly over 2 hours.  If you have not had a bowel movement by tomorrow morning makes the other half of the bottle of MiraLAX and another 64 ounces of fluid and consume it the same way.  You can also take 2 over-the-counter Dulcolax tablets along with your first dose of MiraLAX to help move your stools along.  Increase your oral dietary fiber intake so as to add bulk to your stool and help decrease incidence of constipation.  If you have any increasing abdominal pain, blood in your stool, abdominal bloating, or fever please follow-up in the emergency department for evaluation.      ED Prescriptions   None    PDMP not reviewed this encounter.   Becky Augusta, NP 11/28/23 1254

## 2023-12-14 ENCOUNTER — Other Ambulatory Visit: Payer: Self-pay | Admitting: Physician Assistant

## 2023-12-14 DIAGNOSIS — K5909 Other constipation: Secondary | ICD-10-CM

## 2023-12-14 DIAGNOSIS — R1084 Generalized abdominal pain: Secondary | ICD-10-CM

## 2023-12-16 ENCOUNTER — Ambulatory Visit
Admission: RE | Admit: 2023-12-16 | Discharge: 2023-12-16 | Disposition: A | Discharge status: Home / Self Care | Source: Ambulatory Visit | Attending: Physician Assistant | Admitting: Physician Assistant

## 2023-12-16 DIAGNOSIS — K5909 Other constipation: Secondary | ICD-10-CM | POA: Diagnosis present

## 2023-12-16 DIAGNOSIS — R1084 Generalized abdominal pain: Secondary | ICD-10-CM | POA: Insufficient documentation

## 2023-12-16 MED ORDER — IOHEXOL 300 MG/ML  SOLN
75.0000 mL | Freq: Once | INTRAMUSCULAR | Status: AC | PRN
  Administered 2023-12-16: 75 mL via INTRAVENOUS
